# Patient Record
Sex: Female | Born: 1955 | Race: White | Hispanic: No | Marital: Married | Smoking: Never smoker
Health system: Southern US, Community
[De-identification: ages and names within clinical notes are randomized; demographics above are authoritative.]

## PROBLEM LIST (undated history)

## (undated) DIAGNOSIS — K649 Unspecified hemorrhoids: Secondary | ICD-10-CM

## (undated) DIAGNOSIS — I4891 Unspecified atrial fibrillation: Secondary | ICD-10-CM

## (undated) HISTORY — PX: OTHER SURGICAL HISTORY: SHX169

## (undated) HISTORY — DX: Unspecified hemorrhoids: K64.9

---

## 1998-05-25 ENCOUNTER — Other Ambulatory Visit: Admission: RE | Admit: 1998-05-25 | Discharge: 1998-05-25 | Payer: Self-pay | Admitting: *Deleted

## 1999-01-31 ENCOUNTER — Ambulatory Visit (HOSPITAL_COMMUNITY): Admission: RE | Admit: 1999-01-31 | Discharge: 1999-01-31 | Payer: Self-pay | Admitting: *Deleted

## 1999-01-31 ENCOUNTER — Encounter: Payer: Self-pay | Admitting: *Deleted

## 2000-02-06 ENCOUNTER — Ambulatory Visit (HOSPITAL_COMMUNITY): Admission: RE | Admit: 2000-02-06 | Discharge: 2000-02-06 | Payer: Self-pay | Admitting: *Deleted

## 2000-02-06 ENCOUNTER — Encounter: Payer: Self-pay | Admitting: *Deleted

## 2001-05-14 ENCOUNTER — Encounter: Payer: Self-pay | Admitting: *Deleted

## 2001-05-14 ENCOUNTER — Ambulatory Visit (HOSPITAL_COMMUNITY): Admission: RE | Admit: 2001-05-14 | Discharge: 2001-05-14 | Payer: Self-pay | Admitting: *Deleted

## 2001-10-14 ENCOUNTER — Other Ambulatory Visit: Admission: RE | Admit: 2001-10-14 | Discharge: 2001-10-14 | Payer: Self-pay | Admitting: *Deleted

## 2001-11-18 ENCOUNTER — Other Ambulatory Visit: Admission: RE | Admit: 2001-11-18 | Discharge: 2001-11-18 | Payer: Self-pay | Admitting: *Deleted

## 2002-09-26 ENCOUNTER — Ambulatory Visit (HOSPITAL_COMMUNITY): Admission: RE | Admit: 2002-09-26 | Discharge: 2002-09-26 | Payer: Self-pay | Admitting: *Deleted

## 2002-09-26 ENCOUNTER — Encounter: Payer: Self-pay | Admitting: *Deleted

## 2004-03-14 ENCOUNTER — Ambulatory Visit (HOSPITAL_COMMUNITY): Admission: RE | Admit: 2004-03-14 | Discharge: 2004-03-14 | Payer: Self-pay | Admitting: *Deleted

## 2004-04-22 ENCOUNTER — Encounter: Admission: RE | Admit: 2004-04-22 | Discharge: 2004-04-22 | Payer: Self-pay | Admitting: Orthopedic Surgery

## 2005-03-28 ENCOUNTER — Other Ambulatory Visit: Admission: RE | Admit: 2005-03-28 | Discharge: 2005-03-28 | Payer: Self-pay | Admitting: *Deleted

## 2006-03-26 ENCOUNTER — Ambulatory Visit (HOSPITAL_COMMUNITY): Admission: RE | Admit: 2006-03-26 | Discharge: 2006-03-26 | Payer: Self-pay | Admitting: *Deleted

## 2006-05-16 ENCOUNTER — Other Ambulatory Visit: Admission: RE | Admit: 2006-05-16 | Discharge: 2006-05-16 | Payer: Self-pay | Admitting: *Deleted

## 2007-06-04 ENCOUNTER — Other Ambulatory Visit: Admission: RE | Admit: 2007-06-04 | Discharge: 2007-06-04 | Payer: Self-pay | Admitting: *Deleted

## 2007-06-04 ENCOUNTER — Ambulatory Visit (HOSPITAL_COMMUNITY): Admission: RE | Admit: 2007-06-04 | Discharge: 2007-06-04 | Payer: Self-pay | Admitting: *Deleted

## 2008-05-13 ENCOUNTER — Emergency Department (HOSPITAL_COMMUNITY): Admission: EM | Admit: 2008-05-13 | Discharge: 2008-05-13 | Payer: Self-pay | Admitting: Emergency Medicine

## 2008-06-03 ENCOUNTER — Other Ambulatory Visit: Admission: RE | Admit: 2008-06-03 | Discharge: 2008-06-03 | Payer: Self-pay | Admitting: Gynecology

## 2008-06-08 ENCOUNTER — Ambulatory Visit (HOSPITAL_COMMUNITY): Admission: RE | Admit: 2008-06-08 | Discharge: 2008-06-08 | Payer: Self-pay | Admitting: *Deleted

## 2009-06-14 ENCOUNTER — Ambulatory Visit (HOSPITAL_COMMUNITY): Admission: RE | Admit: 2009-06-14 | Discharge: 2009-06-14 | Payer: Self-pay | Admitting: Gynecology

## 2010-06-15 ENCOUNTER — Ambulatory Visit (HOSPITAL_COMMUNITY): Admission: RE | Admit: 2010-06-15 | Discharge: 2010-06-15 | Payer: Self-pay | Admitting: Gynecology

## 2011-07-03 ENCOUNTER — Other Ambulatory Visit (HOSPITAL_COMMUNITY): Payer: Self-pay | Admitting: Internal Medicine

## 2011-07-03 DIAGNOSIS — Z1231 Encounter for screening mammogram for malignant neoplasm of breast: Secondary | ICD-10-CM

## 2011-07-12 ENCOUNTER — Ambulatory Visit (HOSPITAL_COMMUNITY)
Admission: RE | Admit: 2011-07-12 | Discharge: 2011-07-12 | Disposition: A | Discharge status: Home / Self Care | Payer: BC Managed Care – PPO | Source: Ambulatory Visit | Attending: Internal Medicine | Admitting: Internal Medicine

## 2011-07-12 DIAGNOSIS — Z1231 Encounter for screening mammogram for malignant neoplasm of breast: Secondary | ICD-10-CM | POA: Insufficient documentation

## 2011-10-19 ENCOUNTER — Other Ambulatory Visit: Payer: Self-pay | Admitting: Adult Health

## 2011-10-19 ENCOUNTER — Other Ambulatory Visit (HOSPITAL_COMMUNITY)
Admission: RE | Admit: 2011-10-19 | Discharge: 2011-10-19 | Disposition: A | Discharge status: Home / Self Care | Payer: BC Managed Care – PPO | Source: Ambulatory Visit | Attending: Obstetrics and Gynecology | Admitting: Obstetrics and Gynecology

## 2011-10-19 DIAGNOSIS — Z01419 Encounter for gynecological examination (general) (routine) without abnormal findings: Secondary | ICD-10-CM | POA: Insufficient documentation

## 2012-07-16 ENCOUNTER — Other Ambulatory Visit (HOSPITAL_COMMUNITY): Payer: Self-pay | Admitting: Internal Medicine

## 2012-07-16 DIAGNOSIS — Z1231 Encounter for screening mammogram for malignant neoplasm of breast: Secondary | ICD-10-CM

## 2012-07-24 ENCOUNTER — Ambulatory Visit (HOSPITAL_COMMUNITY): Payer: BC Managed Care – PPO

## 2012-08-07 ENCOUNTER — Ambulatory Visit (HOSPITAL_COMMUNITY)
Admission: RE | Admit: 2012-08-07 | Discharge: 2012-08-07 | Disposition: A | Discharge status: Home / Self Care | Payer: BC Managed Care – PPO | Source: Ambulatory Visit | Attending: Internal Medicine | Admitting: Internal Medicine

## 2012-08-07 DIAGNOSIS — Z1231 Encounter for screening mammogram for malignant neoplasm of breast: Secondary | ICD-10-CM | POA: Insufficient documentation

## 2012-11-18 ENCOUNTER — Other Ambulatory Visit (HOSPITAL_COMMUNITY)
Admission: RE | Admit: 2012-11-18 | Discharge: 2012-11-18 | Disposition: A | Discharge status: Home / Self Care | Payer: BC Managed Care – PPO | Source: Ambulatory Visit | Attending: Obstetrics and Gynecology | Admitting: Obstetrics and Gynecology

## 2012-11-18 ENCOUNTER — Other Ambulatory Visit: Payer: Self-pay | Admitting: Adult Health

## 2012-11-18 DIAGNOSIS — Z1151 Encounter for screening for human papillomavirus (HPV): Secondary | ICD-10-CM | POA: Insufficient documentation

## 2012-11-18 DIAGNOSIS — Z01419 Encounter for gynecological examination (general) (routine) without abnormal findings: Secondary | ICD-10-CM | POA: Insufficient documentation

## 2013-09-29 ENCOUNTER — Other Ambulatory Visit: Payer: Self-pay | Admitting: Adult Health

## 2013-09-29 DIAGNOSIS — Z1231 Encounter for screening mammogram for malignant neoplasm of breast: Secondary | ICD-10-CM

## 2013-10-15 ENCOUNTER — Ambulatory Visit (HOSPITAL_COMMUNITY): Payer: BC Managed Care – PPO

## 2013-10-23 ENCOUNTER — Ambulatory Visit (HOSPITAL_COMMUNITY)
Admission: RE | Admit: 2013-10-23 | Discharge: 2013-10-23 | Disposition: A | Discharge status: Home / Self Care | Payer: BC Managed Care – PPO | Source: Ambulatory Visit | Attending: Adult Health | Admitting: Adult Health

## 2013-10-23 DIAGNOSIS — Z1231 Encounter for screening mammogram for malignant neoplasm of breast: Secondary | ICD-10-CM | POA: Insufficient documentation

## 2013-11-25 ENCOUNTER — Other Ambulatory Visit: Payer: Self-pay | Admitting: Adult Health

## 2013-12-03 ENCOUNTER — Encounter: Payer: Self-pay | Admitting: Adult Health

## 2013-12-03 ENCOUNTER — Ambulatory Visit (INDEPENDENT_AMBULATORY_CARE_PROVIDER_SITE_OTHER): Payer: BC Managed Care – PPO | Admitting: Adult Health

## 2013-12-03 VITALS — BP 148/74 | HR 74 | Ht 66.5 in | Wt 184.0 lb

## 2013-12-03 DIAGNOSIS — Z1212 Encounter for screening for malignant neoplasm of rectum: Secondary | ICD-10-CM

## 2013-12-03 DIAGNOSIS — K649 Unspecified hemorrhoids: Secondary | ICD-10-CM

## 2013-12-03 DIAGNOSIS — Z01419 Encounter for gynecological examination (general) (routine) without abnormal findings: Secondary | ICD-10-CM

## 2013-12-03 HISTORY — DX: Unspecified hemorrhoids: K64.9

## 2013-12-03 LAB — HEMOCCULT GUIAC POC 1CARD (OFFICE)

## 2013-12-03 NOTE — Patient Instructions (Signed)
Physical in 1 year Mammogram yearly Colonoscopy  Advised Labs next year Hemorrhoids Hemorrhoids are swollen veins around the rectum or anus. There are two types of hemorrhoids:   Internal hemorrhoids. These occur in the veins just inside the rectum. They may poke through to the outside and become irritated and painful.  External hemorrhoids. These occur in the veins outside the anus and can be felt as a painful swelling or hard lump near the anus. CAUSES  Pregnancy.   Obesity.   Constipation or diarrhea.   Straining to have a bowel movement.   Sitting for long periods on the toilet.  Heavy lifting or other activity that caused you to strain.  Anal intercourse. SYMPTOMS   Pain.   Anal itching or irritation.   Rectal bleeding.   Fecal leakage.   Anal swelling.   One or more lumps around the anus.  DIAGNOSIS  Your caregiver may be able to diagnose hemorrhoids by visual examination. Other examinations or tests that may be performed include:   Examination of the rectal area with a gloved hand (digital rectal exam).   Examination of anal canal using a small tube (scope).   A blood test if you have lost a significant amount of blood.  A test to look inside the colon (sigmoidoscopy or colonoscopy). TREATMENT Most hemorrhoids can be treated at home. However, if symptoms do not seem to be getting better or if you have a lot of rectal bleeding, your caregiver may perform a procedure to help make the hemorrhoids get smaller or remove them completely. Possible treatments include:   Placing a rubber band at the base of the hemorrhoid to cut off the circulation (rubber band ligation).   Injecting a chemical to shrink the hemorrhoid (sclerotherapy).   Using a tool to burn the hemorrhoid (infrared light therapy).   Surgically removing the hemorrhoid (hemorrhoidectomy).   Stapling the hemorrhoid to block blood flow to the tissue (hemorrhoid stapling).  HOME  CARE INSTRUCTIONS   Eat foods with fiber, such as whole grains, beans, nuts, fruits, and vegetables. Ask your doctor about taking products with added fiber in them (fibersupplements).  Increase fluid intake. Drink enough water and fluids to keep your urine clear or pale yellow.   Exercise regularly.   Go to the bathroom when you have the urge to have a bowel movement. Do not wait.   Avoid straining to have bowel movements.   Keep the anal area dry and clean. Use wet toilet paper or moist towelettes after a bowel movement.   Medicated creams and suppositories may be used or applied as directed.   Only take over-the-counter or prescription medicines as directed by your caregiver.   Take warm sitz baths for 15 20 minutes, 3 4 times a day to ease pain and discomfort.   Place ice packs on the hemorrhoids if they are tender and swollen. Using ice packs between sitz baths may be helpful.   Put ice in a plastic bag.   Place a towel between your skin and the bag.   Leave the ice on for 15 20 minutes, 3 4 times a day.   Do not use a donut-shaped pillow or sit on the toilet for long periods. This increases blood pooling and pain.  SEEK MEDICAL CARE IF:  You have increasing pain and swelling that is not controlled by treatment or medicine.  You have uncontrolled bleeding.  You have difficulty or you are unable to have a bowel movement.  You have  pain or inflammation outside the area of the hemorrhoids. MAKE SURE YOU:  Understand these instructions.  Will watch your condition.  Will get help right away if you are not doing well or get worse. Document Released: 12/01/2000 Document Revised: 11/20/2012 Document Reviewed: 10/08/2012 [facility] Patient Information 2014 [place], [place].

## 2013-12-03 NOTE — Progress Notes (Signed)
Patient ID: Janet Rangel, female   DOB: 10/29/1956, 57 y.o.   MRN: [id number] History of Present Illness: Janet Rangel is a 57 year old white female, married in for physical.She had a normal pap with negative HPV 11/17/12.Has hemorrhoids.   Current Medications, Allergies, Past Medical History, Past Surgical History, Family History and Social History were reviewed in [employer] record.     Review of Systems: Patient denies any headaches, blurred vision, shortness of breath, chest pain, abdominal pain, problems with bowel movements, urination, or intercourse. No joint pain or mood swings.Has had some hemorrhoids, but better.    Physical Exam:BP 148/74  Pulse 74  Ht 5' 6.5" (1.689 m)  Wt 184 lb (83.462 kg)  BMI 29.26 kg/m2 General:  Well developed, well nourished, no acute distress Skin:  Warm and dry Neck:  Midline trachea, normal thyroid Lungs; Clear to auscultation bilaterally Breast:  No dominant palpable mass, retraction, or nipple discharge Cardiovascular: Regular rate and rhythm Abdomen:  Soft, non tender, no hepatosplenomegaly Pelvic:  External genitalia is normal in appearance.  The vagina is normal in appearance for age.               The cervix is bulbous.  Uterus is felt to be normal size, shape, and contour.  No adnexal masses or tenderness noted. Rectal: Good sphincter tone, no polyps, has hemorrhoids. Hemoccult negative. Extremities:  No swelling or varicosities noted Psych:  No mood changes, alert and cooperative, seems happy   Impression: Yearly gyn exam no pap Hemorrhoids     Plan: Physical in 1 year Mammogram yearly  Labs next year Colonoscopy advised Review handout on hemorrhoids, can use preparation H, Anusol and tucks

## 2014-10-06 ENCOUNTER — Other Ambulatory Visit: Payer: Self-pay | Admitting: Adult Health

## 2014-10-06 DIAGNOSIS — Z1231 Encounter for screening mammogram for malignant neoplasm of breast: Secondary | ICD-10-CM

## 2014-10-19 ENCOUNTER — Encounter: Payer: Self-pay | Admitting: Adult Health

## 2014-10-29 ENCOUNTER — Ambulatory Visit (HOSPITAL_COMMUNITY)
Admission: RE | Admit: 2014-10-29 | Discharge: 2014-10-29 | Disposition: A | Discharge status: Home / Self Care | Payer: BC Managed Care – PPO | Source: Ambulatory Visit | Attending: Adult Health | Admitting: Adult Health

## 2014-10-29 DIAGNOSIS — Z1231 Encounter for screening mammogram for malignant neoplasm of breast: Secondary | ICD-10-CM | POA: Insufficient documentation

## 2014-12-07 ENCOUNTER — Encounter: Payer: Self-pay | Admitting: Adult Health

## 2014-12-07 ENCOUNTER — Ambulatory Visit (INDEPENDENT_AMBULATORY_CARE_PROVIDER_SITE_OTHER): Payer: BC Managed Care – PPO | Admitting: Adult Health

## 2014-12-07 VITALS — BP 140/80 | Ht 66.5 in | Wt 172.0 lb

## 2014-12-07 DIAGNOSIS — Z01419 Encounter for gynecological examination (general) (routine) without abnormal findings: Secondary | ICD-10-CM

## 2014-12-07 DIAGNOSIS — Z1212 Encounter for screening for malignant neoplasm of rectum: Secondary | ICD-10-CM

## 2014-12-07 LAB — HEMOCCULT GUIAC POC 1CARD (OFFICE): Fecal Occult Blood, POC: NEGATIVE

## 2014-12-07 NOTE — Patient Instructions (Signed)
Pap and physical with fastin labs in 1 year Mammogram yearly Colonoscopy advised Get flu shot

## 2014-12-07 NOTE — Progress Notes (Signed)
Patient ID: Janet Rangel, female   DOB: 1956-07-26, 58 y.o.   MRN: [id number] History of Present Illness:  Janet RhodesBetty is a 58 year old white female, married in for gyn exam.She had a normal pap with negative HPV 11/18/12.No complaints.  Current Medications, Allergies, Past Medical History, Past Surgical History, Family History and Social History were reviewed in [facility] electronic medical record.     Review of Systems: Patient denies any headaches, blurred vision, shortness of breath, chest pain, abdominal pain, problems with bowel movements, urination, or intercourse. No joint pian or mood swings,she walks and line dances.    Physical Exam:BP 140/80 mmHg  Ht 5' 6.5" (1.689 m)  Wt 172 lb (78.019 kg)  BMI 27.35 kg/m2 General:  Well developed, well nourished, no acute distress Skin:  Warm and dry Neck:  Midline trachea, normal thyroid Lungs; Clear to auscultation bilaterally Breast:  No dominant palpable mass, retraction, or nipple discharge Cardiovascular: Regular rate and rhythm Abdomen:  Soft, non tender, no hepatosplenomegaly Pelvic:  External genitalia is normal in appearance, no lesions.  The vagina is normal in appearance, except has lost rugae. The cervix is smooth.  Uterus is felt to be normal size, shape, and contour.  No     adnexal masses or tenderness noted. Rectal: Good sphincter tone, no polyps, has internal hemorrhoids felt.  Hemoccult negative. Extremities:  No swelling or varicosities noted Psych:  No mood changes,alert and cooperative,seems happy   Impression: Well woman gyn exam no pap    Plan: Pap and physical with fasting labs in 1 year Mammogram yearly Get flu shot Colonoscopy advised

## 2015-12-23 ENCOUNTER — Ambulatory Visit (INDEPENDENT_AMBULATORY_CARE_PROVIDER_SITE_OTHER): Payer: BC Managed Care – PPO | Admitting: Adult Health

## 2015-12-23 ENCOUNTER — Other Ambulatory Visit (HOSPITAL_COMMUNITY)
Admission: RE | Admit: 2015-12-23 | Discharge: 2015-12-23 | Disposition: A | Discharge status: Home / Self Care | Payer: BC Managed Care – PPO | Source: Ambulatory Visit | Attending: Adult Health | Admitting: Adult Health

## 2015-12-23 ENCOUNTER — Encounter: Payer: Self-pay | Admitting: Adult Health

## 2015-12-23 VITALS — BP 140/84 | HR 60 | Temp 98.2°F | Ht 66.5 in | Wt 180.5 lb

## 2015-12-23 DIAGNOSIS — Z1211 Encounter for screening for malignant neoplasm of colon: Secondary | ICD-10-CM | POA: Diagnosis not present

## 2015-12-23 DIAGNOSIS — Z01419 Encounter for gynecological examination (general) (routine) without abnormal findings: Secondary | ICD-10-CM | POA: Diagnosis not present

## 2015-12-23 DIAGNOSIS — Z1151 Encounter for screening for human papillomavirus (HPV): Secondary | ICD-10-CM | POA: Diagnosis not present

## 2015-12-23 LAB — HEMOCCULT GUIAC POC 1CARD (OFFICE): FECAL OCCULT BLD: NEGATIVE

## 2015-12-23 NOTE — Patient Instructions (Signed)
Mammogram yearly physical in 1 year Think about colonoscopy

## 2015-12-23 NOTE — Progress Notes (Signed)
Patient ID: Janet Rangel, female   DOB: [birth year], 60 y.o.   MRN: [id number] History of Present Illness: Janet Rangel is a 60 year old white female, married in for a well woman gyn exam and pap and has had cough and cold/sinus like symptoms for about 6 days no fever and does not feel bad is taking mucinex DM. She has not had flu shot yet and does not have PCP currently, [place].  Current Medications, Allergies, Past Medical History, Past Surgical History, Family History and Social History were reviewed in [facility] electronic medical record.     Review of Systems: Patient denies any headaches, hearing loss, fatigue, blurred vision, shortness of breath, chest pain, abdominal pain, problems with bowel movements, urination, or intercourse. No joint pain or mood swings.See HPI for positives.    Physical Exam:BP 140/84 mmHg  Pulse 60  Temp(Src) 98.2 F (36.8 C)  Ht 5' 6.5" (1.689 m)  Wt 180 lb 8 oz (81.874 kg)  BMI 28.70 kg/m2 General:  Well developed, well nourished, no acute distress Skin:  Warm and dry Neck:  Midline trachea, normal thyroid, good ROM, no lymphadenopathy.  Ears with wax, throat clear, no redness,swelling or pustules, and no sinus tenderness on palpation. Lungs; Clear to auscultation bilaterally Breast:  No dominant palpable mass, retraction, or nipple discharge Cardiovascular: Regular rate and rhythm Abdomen:  Soft, non tender, no hepatosplenomegaly Pelvic:  External genitalia is normal in appearance, no lesions.  The vagina is normal in appearance. Urethra has no lesions or masses. The cervix is smooth, pap with HPV performed.  Uterus is felt to be normal size, shape, and contour.  No adnexal masses or tenderness noted.Bladder is non tender, no masses felt. Rectal: Good sphincter tone, no polyps, or hemorrhoids felt.  Hemoccult negative. Extremities/musculoskeletal:  No swelling or varicosities noted, no clubbing or cyanosis Psych:  No mood changes, alert and  cooperative,seems happy Discussed probably has viral URI, continue with mucinex and increase fluids, may last 2 weeks.   Impression: Well woman gyn exam and pap    Plan: Check CBC,CMP,TSH and lipids and Hept C antibody Mammogram yearly Physical in 1 year, pap in 3 if normal Colonoscopy advised Get flu shot

## 2015-12-24 LAB — COMPREHENSIVE METABOLIC PANEL
ALT: 17 IU/L (ref 0–32)
AST: 21 IU/L (ref 0–40)
Albumin/Globulin Ratio: 1.6 (ref 1.1–2.5)
Albumin: 4.2 g/dL (ref 3.6–4.8)
Alkaline Phosphatase: 83 IU/L (ref 39–117)
BUN/Creatinine Ratio: 13 (ref 11–26)
BUN: 12 mg/dL (ref 8–27)
Bilirubin Total: 0.5 mg/dL (ref 0.0–1.2)
CO2: 23 mmol/L (ref 18–29)
Calcium: 10.1 mg/dL (ref 8.7–10.3)
Chloride: 102 mmol/L (ref 96–106)
Creatinine, Ser: 0.9 mg/dL (ref 0.57–1.00)
GFR calc Af Amer: 80 mL/min/{1.73_m2} (ref >59)
GFR calc non Af Amer: 70 mL/min/{1.73_m2} (ref >59)
Globulin, Total: 2.7 g/dL (ref 1.5–4.5)
Glucose: 91 mg/dL (ref 65–99)
Potassium: 5.1 mmol/L (ref 3.5–5.2)
Sodium: 140 mmol/L (ref 134–144)
Total Protein: 6.9 g/dL (ref 6.0–8.5)

## 2015-12-24 LAB — CBC
Hematocrit: 39.3 % (ref 34.0–46.6)
Hemoglobin: 13.5 g/dL (ref 11.1–15.9)
MCH: 29.7 pg (ref 26.6–33.0)
MCHC: 34.4 g/dL (ref 31.5–35.7)
MCV: 86 fL (ref 79–97)
Platelets: 280 10*3/uL (ref 150–379)
RBC: 4.55 x10E6/uL (ref 3.77–5.28)
RDW: 12.5 % (ref 12.3–15.4)
WBC: 6.7 10*3/uL (ref 3.4–10.8)

## 2015-12-24 LAB — LIPID PANEL
CHOLESTEROL TOTAL: 292 mg/dL — AB (ref 100–199)
Chol/HDL Ratio: 4.2 ratio units (ref 0.0–4.4)
HDL: 69 mg/dL (ref >39)
LDL Calculated: 196 mg/dL — ABNORMAL HIGH (ref 0–99)
TRIGLYCERIDES: 137 mg/dL (ref 0–149)
VLDL Cholesterol Cal: 27 mg/dL (ref 5–40)

## 2015-12-24 LAB — HEPATITIS C ANTIBODY

## 2015-12-24 LAB — TSH: TSH: 1.95 u[IU]/mL (ref 0.450–4.500)

## 2015-12-27 ENCOUNTER — Telehealth: Payer: Self-pay | Admitting: Adult Health

## 2015-12-27 LAB — CYTOLOGY - PAP

## 2015-12-27 NOTE — Telephone Encounter (Signed)
Left message about labs, and need to work on cholesterol, through diet and exercise, recheck 6-12 months

## 2015-12-29 ENCOUNTER — Other Ambulatory Visit: Payer: Self-pay | Admitting: Adult Health

## 2015-12-29 DIAGNOSIS — Z1231 Encounter for screening mammogram for malignant neoplasm of breast: Secondary | ICD-10-CM

## 2016-01-03 ENCOUNTER — Ambulatory Visit (HOSPITAL_COMMUNITY): Payer: BC Managed Care – PPO

## 2016-01-05 ENCOUNTER — Ambulatory Visit (HOSPITAL_COMMUNITY)
Admission: RE | Admit: 2016-01-05 | Discharge: 2016-01-05 | Disposition: A | Discharge status: Home / Self Care | Payer: BC Managed Care – PPO | Source: Ambulatory Visit | Attending: Adult Health | Admitting: Adult Health

## 2016-01-05 DIAGNOSIS — Z1231 Encounter for screening mammogram for malignant neoplasm of breast: Secondary | ICD-10-CM | POA: Diagnosis not present

## 2016-12-26 ENCOUNTER — Other Ambulatory Visit (HOSPITAL_COMMUNITY): Payer: Self-pay | Admitting: Internal Medicine

## 2016-12-26 DIAGNOSIS — Z1231 Encounter for screening mammogram for malignant neoplasm of breast: Secondary | ICD-10-CM

## 2017-01-08 ENCOUNTER — Ambulatory Visit (HOSPITAL_COMMUNITY)
Admission: RE | Admit: 2017-01-08 | Discharge: 2017-01-08 | Disposition: A | Discharge status: Home / Self Care | Payer: BC Managed Care – PPO | Source: Ambulatory Visit | Attending: Internal Medicine | Admitting: Internal Medicine

## 2017-01-08 DIAGNOSIS — Z1231 Encounter for screening mammogram for malignant neoplasm of breast: Secondary | ICD-10-CM | POA: Diagnosis present

## 2017-12-19 ENCOUNTER — Other Ambulatory Visit (HOSPITAL_COMMUNITY): Payer: Self-pay | Admitting: Internal Medicine

## 2017-12-19 DIAGNOSIS — Z1231 Encounter for screening mammogram for malignant neoplasm of breast: Secondary | ICD-10-CM

## 2018-01-09 ENCOUNTER — Ambulatory Visit (HOSPITAL_COMMUNITY)
Admission: RE | Admit: 2018-01-09 | Discharge: 2018-01-09 | Disposition: A | Discharge status: Home / Self Care | Payer: BC Managed Care – PPO | Source: Ambulatory Visit | Attending: Internal Medicine | Admitting: Internal Medicine

## 2018-01-09 ENCOUNTER — Encounter (HOSPITAL_COMMUNITY): Payer: Self-pay

## 2018-01-09 ENCOUNTER — Ambulatory Visit (HOSPITAL_COMMUNITY): Payer: BC Managed Care – PPO

## 2018-01-09 DIAGNOSIS — Z1231 Encounter for screening mammogram for malignant neoplasm of breast: Secondary | ICD-10-CM | POA: Insufficient documentation

## 2019-01-02 ENCOUNTER — Other Ambulatory Visit (HOSPITAL_COMMUNITY): Payer: Self-pay | Admitting: Internal Medicine

## 2019-01-02 DIAGNOSIS — Z1231 Encounter for screening mammogram for malignant neoplasm of breast: Secondary | ICD-10-CM

## 2019-01-13 ENCOUNTER — Encounter (HOSPITAL_COMMUNITY): Payer: Self-pay

## 2019-01-13 ENCOUNTER — Ambulatory Visit (HOSPITAL_COMMUNITY)
Admission: RE | Admit: 2019-01-13 | Discharge: 2019-01-13 | Disposition: A | Discharge status: Home / Self Care | Payer: BC Managed Care – PPO | Source: Ambulatory Visit | Attending: Internal Medicine | Admitting: Internal Medicine

## 2019-01-13 DIAGNOSIS — Z1231 Encounter for screening mammogram for malignant neoplasm of breast: Secondary | ICD-10-CM | POA: Diagnosis present

## 2020-01-06 ENCOUNTER — Other Ambulatory Visit (HOSPITAL_COMMUNITY): Payer: Self-pay | Admitting: Internal Medicine

## 2020-01-06 DIAGNOSIS — Z1231 Encounter for screening mammogram for malignant neoplasm of breast: Secondary | ICD-10-CM

## 2020-01-15 ENCOUNTER — Ambulatory Visit (HOSPITAL_COMMUNITY)
Admission: RE | Admit: 2020-01-15 | Discharge: 2020-01-15 | Disposition: A | Discharge status: Home / Self Care | Payer: BC Managed Care – PPO | Source: Ambulatory Visit | Attending: Internal Medicine | Admitting: Internal Medicine

## 2020-01-15 ENCOUNTER — Other Ambulatory Visit: Payer: Self-pay

## 2020-01-15 DIAGNOSIS — Z1231 Encounter for screening mammogram for malignant neoplasm of breast: Secondary | ICD-10-CM | POA: Insufficient documentation

## 2021-01-11 ENCOUNTER — Other Ambulatory Visit (HOSPITAL_COMMUNITY): Payer: Self-pay | Admitting: Internal Medicine

## 2021-01-11 DIAGNOSIS — Z1231 Encounter for screening mammogram for malignant neoplasm of breast: Secondary | ICD-10-CM

## 2021-01-24 ENCOUNTER — Ambulatory Visit (HOSPITAL_COMMUNITY)
Admission: RE | Admit: 2021-01-24 | Discharge: 2021-01-24 | Disposition: A | Discharge status: Home / Self Care | Payer: Medicare PPO | Source: Ambulatory Visit | Attending: Internal Medicine | Admitting: Internal Medicine

## 2021-01-24 ENCOUNTER — Other Ambulatory Visit: Payer: Self-pay

## 2021-01-24 DIAGNOSIS — Z1231 Encounter for screening mammogram for malignant neoplasm of breast: Secondary | ICD-10-CM | POA: Diagnosis not present

## 2021-10-14 ENCOUNTER — Other Ambulatory Visit: Payer: Self-pay | Admitting: Cardiology

## 2021-10-14 ENCOUNTER — Other Ambulatory Visit: Payer: Self-pay

## 2021-10-14 ENCOUNTER — Ambulatory Visit: Payer: Medicare PPO | Admitting: Cardiology

## 2021-10-14 ENCOUNTER — Encounter: Payer: Self-pay | Admitting: Cardiology

## 2021-10-14 ENCOUNTER — Ambulatory Visit (INDEPENDENT_AMBULATORY_CARE_PROVIDER_SITE_OTHER): Payer: Medicare PPO

## 2021-10-14 VITALS — BP 126/82 | HR 66 | Ht 66.0 in | Wt 188.0 lb

## 2021-10-14 DIAGNOSIS — R002 Palpitations: Secondary | ICD-10-CM | POA: Diagnosis not present

## 2021-10-14 DIAGNOSIS — R000 Tachycardia, unspecified: Secondary | ICD-10-CM | POA: Diagnosis not present

## 2021-10-14 NOTE — Progress Notes (Signed)
     Clinical Summary [name] is a 65 y.o.female seen as a new consult, referred by Dr Janet Rangel for the following medical problems.  Tachycardia/Palpitations - isolated episode 2 months ago - feeling of a flutter in chest  in the evening. Lasted about 3 hours - apple watch told her possible afib.  - nothing out of the ordinary that day   Low HRs at times on apple watch, 40s to 50s. Denies any specific symptoms   Past Medical History:  Diagnosis Date   Hemorrhoids 12/03/2013     No Known Allergies   Current Outpatient Medications  Medication Sig Dispense Refill   dextromethorphan-guaiFENesin (MUCINEX DM) 30-600 MG 12hr tablet Take 1 tablet by mouth 2 (two) times daily as needed for cough.     No current facility-administered medications for this visit.     Past Surgical History:  Procedure Laterality Date   cyst removed       No Known Allergies    Family History  Problem Relation Age of Onset   Diabetes Father    Cancer Maternal Grandmother        breast     Social History [name] reports that she has never smoked. She has never used smokeless tobacco. [name] reports current alcohol use.   Review of Systems CONSTITUTIONAL: No weight loss, fever, chills, weakness or fatigue.  HEENT: Eyes: No visual loss, blurred vision, double vision or yellow sclerae.No hearing loss, sneezing, congestion, runny nose or sore throat.  SKIN: No rash or itching.  CARDIOVASCULAR: per hpi RESPIRATORY: No shortness of breath, cough or sputum.  GASTROINTESTINAL: No anorexia, nausea, vomiting or diarrhea. No abdominal pain or blood.  GENITOURINARY: No burning on urination, no polyuria NEUROLOGICAL: No headache, dizziness, syncope, paralysis, ataxia, numbness or tingling in the extremities. No change in bowel or bladder control.  MUSCULOSKELETAL: No muscle, back pain, joint pain or stiffness.  LYMPHATICS: No enlarged nodes. No history of splenectomy.  PSYCHIATRIC: No  history of depression or anxiety.  ENDOCRINOLOGIC: No reports of sweating, cold or heat intolerance. No polyuria or polydipsia.  Marland Kitchen   Physical Examination Today's Vitals   10/14/21 1312  BP: 126/82  Pulse: 66  SpO2: 98%  Weight: 188 lb (85.3 kg)  Height: 5\' 6"  (1.676 m)   Body mass index is 30.34 kg/m.  Gen: resting comfortably, no acute distress HEENT: no scleral icterus, pupils equal round and reactive, no palptable cervical adenopathy,  CV: RRR, no m/r/g no jvd Resp: Clear to auscultation bilaterally GI: abdomen is soft, non-tender, non-distended, normal bowel sounds, no hepatosplenomegaly MSK: extremities are warm, no edema.  Skin: warm, no rash Neuro:  no focal deficits Psych: appropriate affect   Assessment and Plan  Palpitations -unclear etiology - will obtain 14 day monitor to further evaluate -EKG shows NSR   F/u pending monitor results      , M.D.,

## 2021-10-14 NOTE — Patient Instructions (Signed)
Medication Instructions:  Your physician recommends that you continue on your current medications as directed. Please refer to the Current Medication list given to you today.  *If you need a refill on your cardiac medications before your next appointment, please call your pharmacy*   Lab Work: None If you have labs (blood work) drawn today and your tests are completely normal, you will receive your results only by: MyChart Message (if you have MyChart) OR A paper copy in the mail If you have any lab test that is abnormal or we need to change your treatment, we will call you to review the results.   Testing/Procedures: None   Follow-Up: At [facility], you and your health needs are our priority.  As part of our continuing mission to provide you with exceptional heart care, we have created designated Provider Care Teams.  These Care Teams include your primary Cardiologist (physician) and Advanced Practice Providers (APPs -  Physician Assistants and Nurse Practitioners) who all work together to provide you with the care you need, when you need it.  We recommend signing up for the patient portal called "MyChart".  Sign up information is provided on this After Visit Summary.  MyChart is used to connect with patients for Virtual Visits (Telemedicine).  Patients are able to view lab/test results, encounter notes, upcoming appointments, etc.  Non-urgent messages can be sent to your provider as well.   To learn more about what you can do with MyChart, go to [email or web address].    Your next appointment:   Pending Monitor Results   Other Instructions ZIO XT- Long Term Monitor Instructions   Your physician has requested you wear your ZIO patch monitor___14____days.   This is a single patch monitor.  [employer] supplies one patch monitor per enrollment.  Additional stickers are not available.   Please do not apply patch if you will be having a Nuclear Stress Test, Echocardiogram, Cardiac  CT, MRI, or Chest Xray during the time frame you would be wearing the monitor. The patch cannot be worn during these tests.  You cannot remove and re-apply the ZIO XT patch monitor.   Your ZIO patch monitor will be sent USPS Priority mail from [facility] directly to your home address. The monitor may also be mailed to a PO BOX if home delivery is not available.   It may take 3-5 days to receive your monitor after you have been enrolled.   Once you have received you monitor, please review enclosed instructions.  Your monitor has already been registered assigning a specific monitor serial # to you.   Applying the monitor   Shave hair from upper left chest.   Hold abrader disc by orange tab.  Rub abrader in 40 strokes over left upper chest as indicated in your monitor instructions.   Clean area with 4 enclosed alcohol pads .  Use all pads to assure are is cleaned thoroughly.  Let dry.   Apply patch as indicated in monitor instructions.  Patch will be place under collarbone on left side of chest with arrow pointing upward.   Rub patch adhesive wings for 2 minutes.Remove white label marked "1".  Remove white label marked "2".  Rub patch adhesive wings for 2 additional minutes.   While looking in a mirror, press and release button in center of patch.  A small green light will flash 3-4 times .  This will be your only indicator the monitor has been turned on.     Do not  shower for the first 24 hours.  You may shower after the first 24 hours.   Press button if you feel a symptom. You will hear a small click.  Record Date, Time and Symptom in the Patient Log Book.   When you are ready to remove patch, follow instructions on last 2 pages of Patient Log Book.  Stick patch monitor onto last page of Patient Log Book.   Place Patient Log Book in [place] box.  Use locking tab on box and tape box closed securely.  The [employer] and [employer] has [employer] on it.  Please place in mailbox as soon  as possible.  Your physician should have your test results approximately 7 days after the monitor has been mailed back to [facility].   Call [facility] Customer Care at [phone] if you have questions regarding your ZIO XT patch monitor.  Call them immediately if you see an orange light blinking on your monitor.   If your monitor falls off in less than 4 days contact our Monitor department at [phone].  If your monitor becomes loose or falls off after 4 days call [employer] at [phone] for suggestions on securing your monitor.

## 2021-10-28 DIAGNOSIS — R002 Palpitations: Secondary | ICD-10-CM

## 2021-12-13 ENCOUNTER — Telehealth: Payer: Self-pay

## 2021-12-13 NOTE — Telephone Encounter (Signed)
-----   Message from Antoine Poche, MD sent at 12/12/2021 11:00 AM EST ----- Monitor shows some occasional extra heart beats, sometimes she can have a few in a row. None of the findings are dangerous but can cause symptoms of heart skipping or fluttering. Its not something we have to treat unless the symptoms are ongoing and bothersome. The medication we typically use slows the heart rate mildly, something we would need to watch for her since her heart rates are on the low normal range already.   If ongoing symptoms can try lopressor 12.5mg  bid, f/u 6 months but keep [place] posted on symptoms.    Dominga Ferry MD

## 2021-12-13 NOTE — Telephone Encounter (Signed)
Patient notified and verbalized understanding. Pt stated that symptoms are not noticeable or bothersome as of now, but would keep [place] posted.

## 2022-01-09 ENCOUNTER — Other Ambulatory Visit (HOSPITAL_COMMUNITY): Payer: Self-pay | Admitting: Internal Medicine

## 2022-01-09 DIAGNOSIS — Z1231 Encounter for screening mammogram for malignant neoplasm of breast: Secondary | ICD-10-CM

## 2022-01-26 ENCOUNTER — Ambulatory Visit (HOSPITAL_COMMUNITY)
Admission: RE | Admit: 2022-01-26 | Discharge: 2022-01-26 | Disposition: A | Discharge status: Home / Self Care | Payer: Medicare PPO | Source: Ambulatory Visit | Attending: Internal Medicine | Admitting: Internal Medicine

## 2022-01-26 ENCOUNTER — Other Ambulatory Visit: Payer: Self-pay

## 2022-01-26 DIAGNOSIS — Z1231 Encounter for screening mammogram for malignant neoplasm of breast: Secondary | ICD-10-CM | POA: Diagnosis present

## 2022-08-10 IMAGING — MG MM DIGITAL SCREENING BILAT W/ TOMO AND CAD
6 of 12 series · 6 of 36 positions shown · non-contrast
Comparison: Previous exam(s).

ACR Breast Density Category a: The breast tissue is almost entirely
fatty.

CLINICAL DATA: Screening.

EXAM:
DIGITAL SCREENING BILATERAL MAMMOGRAM WITH TOMOSYNTHESIS AND CAD
TECHNIQUE: Bilateral screening digital craniocaudal and mediolateral oblique
mammograms were obtained. Bilateral screening digital breast
tomosynthesis was performed. The images were evaluated with
computer-aided detection.

[R XCCL synth-2D]
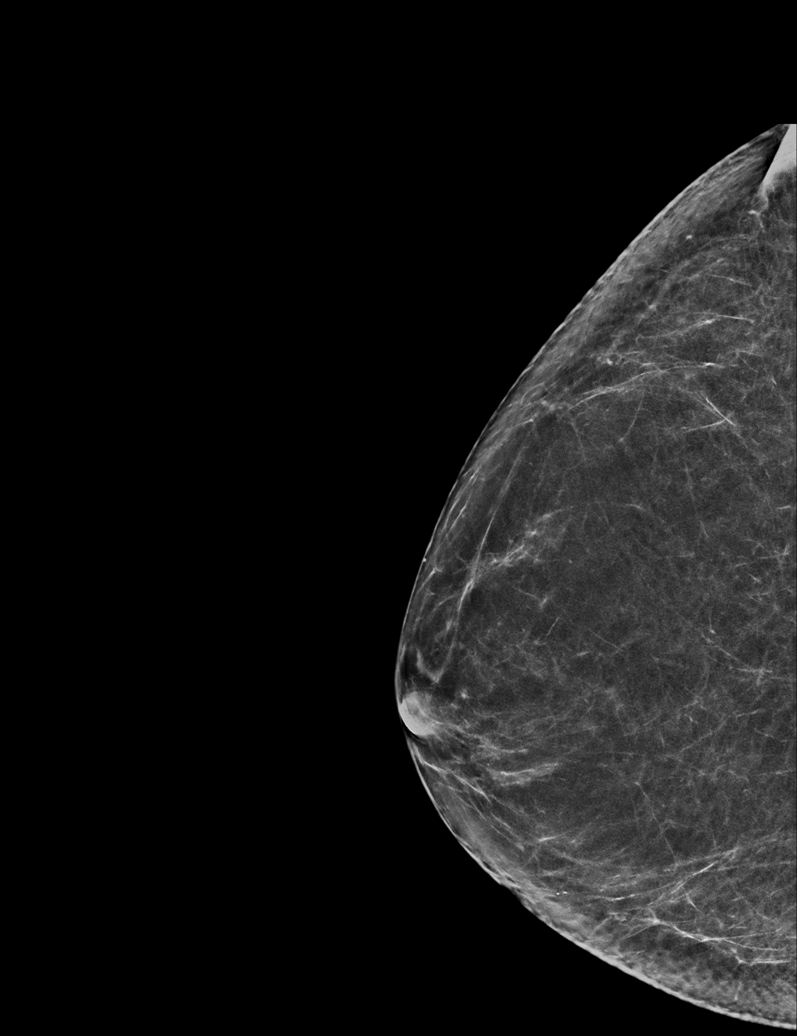

[R CC synth-2D]
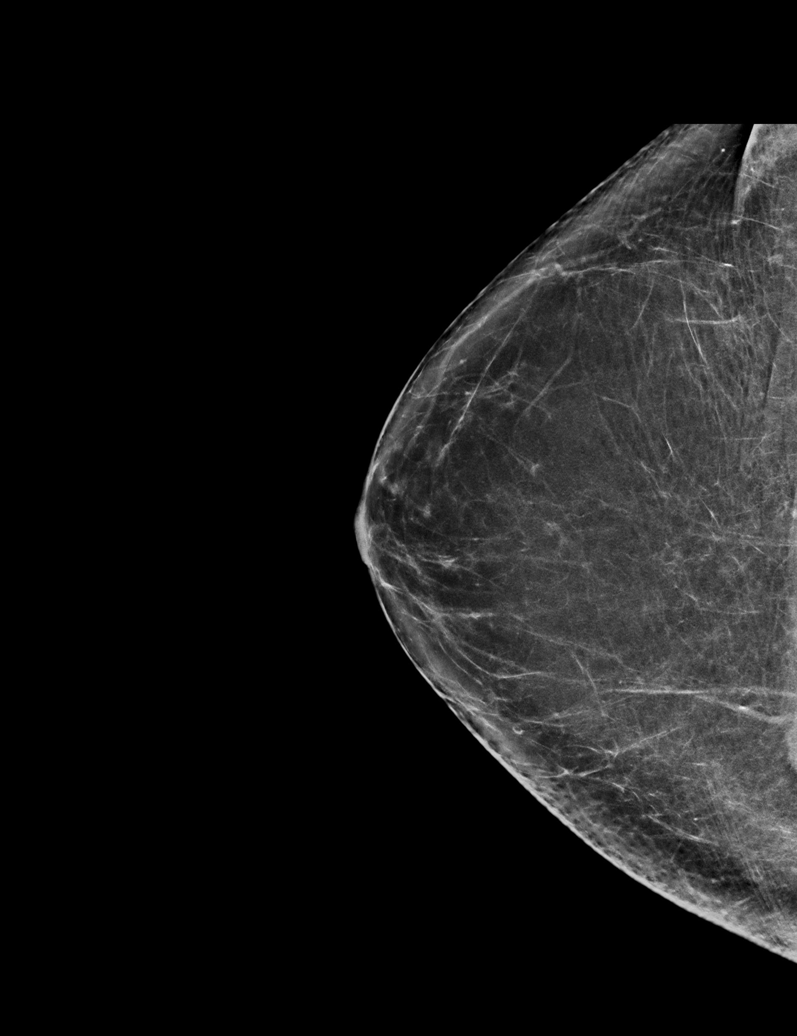

[R MLO synth-2D]
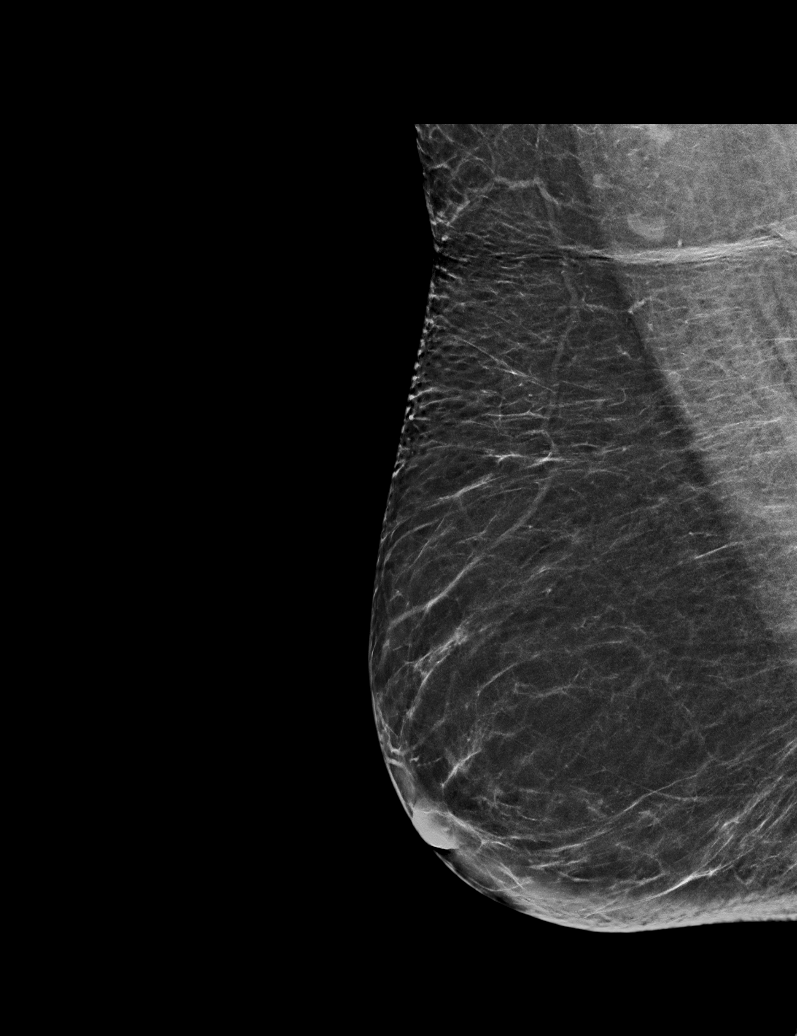

[L XCCL synth-2D]
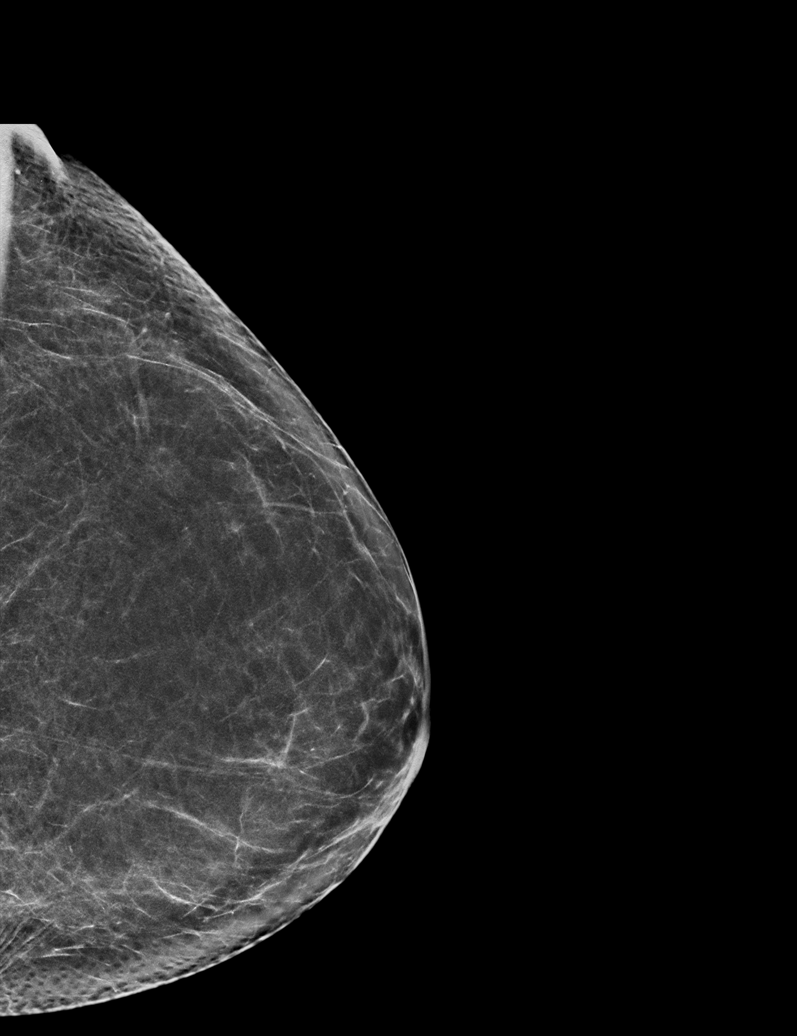

[L CC synth-2D]
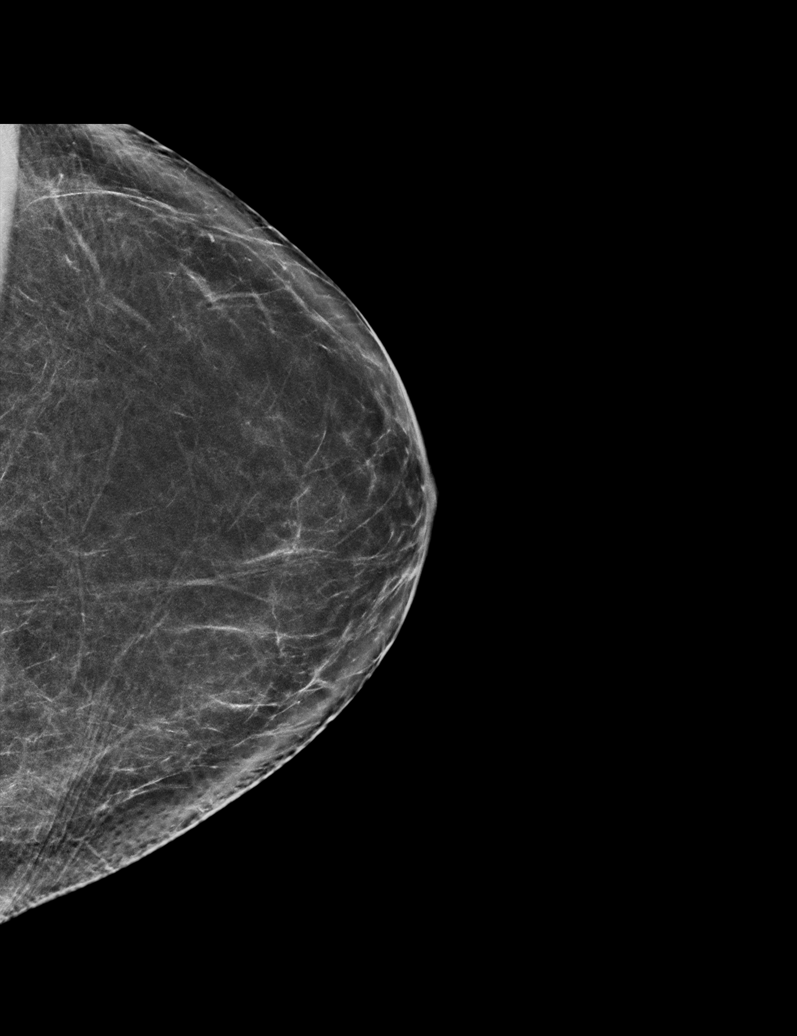

[L MLO synth-2D]
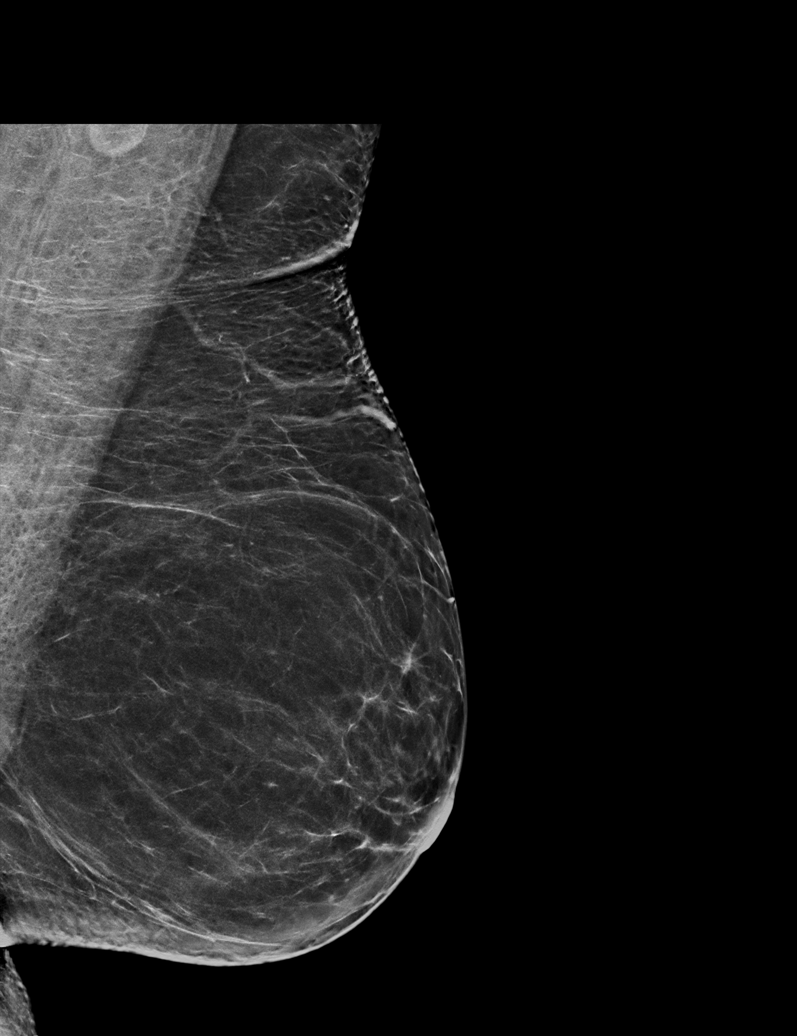

[6 of 36 positions shown; findings below may reference images not displayed]

FINDINGS: There are no findings suspicious for malignancy.
IMPRESSION: No mammographic evidence of malignancy. A result letter of this
screening mammogram will be mailed directly to the patient.

RECOMMENDATION:
Screening mammogram in one year. (Code:0E-3-N98)

BI-RADS CATEGORY  1: Negative.

## 2022-12-05 DIAGNOSIS — L570 Actinic keratosis: Secondary | ICD-10-CM | POA: Diagnosis not present

## 2022-12-05 DIAGNOSIS — X32XXXD Exposure to sunlight, subsequent encounter: Secondary | ICD-10-CM | POA: Diagnosis not present

## 2022-12-05 DIAGNOSIS — L650 Telogen effluvium: Secondary | ICD-10-CM | POA: Diagnosis not present

## 2023-02-06 ENCOUNTER — Other Ambulatory Visit (HOSPITAL_COMMUNITY): Payer: Self-pay | Admitting: Internal Medicine

## 2023-02-06 DIAGNOSIS — Z1231 Encounter for screening mammogram for malignant neoplasm of breast: Secondary | ICD-10-CM

## 2023-02-14 ENCOUNTER — Encounter (HOSPITAL_COMMUNITY): Payer: Self-pay

## 2023-02-14 ENCOUNTER — Ambulatory Visit (HOSPITAL_COMMUNITY)
Admission: RE | Admit: 2023-02-14 | Discharge: 2023-02-14 | Disposition: A | Discharge status: Home / Self Care | Payer: Medicare PPO | Source: Ambulatory Visit | Attending: Internal Medicine | Admitting: Internal Medicine

## 2023-02-14 DIAGNOSIS — Z1231 Encounter for screening mammogram for malignant neoplasm of breast: Secondary | ICD-10-CM | POA: Diagnosis not present

## 2023-04-19 ENCOUNTER — Telehealth: Payer: Self-pay | Admitting: Cardiology

## 2023-04-19 ENCOUNTER — Emergency Department (HOSPITAL_COMMUNITY): Payer: Medicare PPO

## 2023-04-19 ENCOUNTER — Encounter (HOSPITAL_COMMUNITY): Payer: Self-pay

## 2023-04-19 ENCOUNTER — Emergency Department (HOSPITAL_COMMUNITY)
Admission: EM | Admit: 2023-04-19 | Discharge: 2023-04-19 | Disposition: A | Discharge status: Home / Self Care | Payer: Medicare PPO | Attending: Emergency Medicine | Admitting: Emergency Medicine

## 2023-04-19 ENCOUNTER — Other Ambulatory Visit: Payer: Self-pay

## 2023-04-19 DIAGNOSIS — I4891 Unspecified atrial fibrillation: Secondary | ICD-10-CM | POA: Diagnosis not present

## 2023-04-19 DIAGNOSIS — D72829 Elevated white blood cell count, unspecified: Secondary | ICD-10-CM | POA: Insufficient documentation

## 2023-04-19 DIAGNOSIS — R000 Tachycardia, unspecified: Secondary | ICD-10-CM | POA: Diagnosis present

## 2023-04-19 HISTORY — DX: Unspecified atrial fibrillation: I48.91

## 2023-04-19 LAB — TSH: TSH: 2.187 u[IU]/mL (ref 0.350–4.500)

## 2023-04-19 LAB — CBC WITH DIFFERENTIAL/PLATELET
Abs Immature Granulocytes: 0.04 10*3/uL (ref 0.00–0.07)
Basophils Absolute: 0 10*3/uL (ref 0.0–0.1)
Basophils Relative: 0 %
Eosinophils Absolute: 0.1 10*3/uL (ref 0.0–0.5)
Eosinophils Relative: 0 %
HCT: 42.2 % (ref 36.0–46.0)
Hemoglobin: 13.8 g/dL (ref 12.0–15.0)
Immature Granulocytes: 0 %
Lymphocytes Relative: 15 %
Lymphs Abs: 1.7 10*3/uL (ref 0.7–4.0)
MCH: 29.7 pg (ref 26.0–34.0)
MCHC: 32.7 g/dL (ref 30.0–36.0)
MCV: 90.9 fL (ref 80.0–100.0)
Monocytes Absolute: 0.8 10*3/uL (ref 0.1–1.0)
Monocytes Relative: 7 %
Neutro Abs: 8.7 10*3/uL — ABNORMAL HIGH (ref 1.7–7.7)
Neutrophils Relative %: 78 %
Platelets: 266 10*3/uL (ref 150–400)
RBC: 4.64 MIL/uL (ref 3.87–5.11)
RDW: 12.1 % (ref 11.5–15.5)
WBC: 11.3 10*3/uL — ABNORMAL HIGH (ref 4.0–10.5)
nRBC: 0 % (ref 0.0–0.2)

## 2023-04-19 LAB — COMPREHENSIVE METABOLIC PANEL
ALT: 21 U/L (ref 0–44)
AST: 28 U/L (ref 15–41)
Albumin: 4 g/dL (ref 3.5–5.0)
Alkaline Phosphatase: 75 U/L (ref 38–126)
Anion gap: 15 (ref 5–15)
BUN: 14 mg/dL (ref 8–23)
CO2: 21 mmol/L — ABNORMAL LOW (ref 22–32)
Calcium: 10.1 mg/dL (ref 8.9–10.3)
Chloride: 101 mmol/L (ref 98–111)
Creatinine, Ser: 1 mg/dL (ref 0.44–1.00)
GFR, Estimated: >60 mL/min (ref >60)
Glucose, Bld: 111 mg/dL — ABNORMAL HIGH (ref 70–99)
Potassium: 3.8 mmol/L (ref 3.5–5.1)
Sodium: 137 mmol/L (ref 135–145)
Total Bilirubin: 0.9 mg/dL (ref 0.3–1.2)
Total Protein: 7.1 g/dL (ref 6.5–8.1)

## 2023-04-19 LAB — TROPONIN I (HIGH SENSITIVITY): Troponin I (High Sensitivity): 16 ng/L (ref <18)

## 2023-04-19 MED ORDER — SODIUM CHLORIDE 0.9 % IV BOLUS
1000.0000 mL | Freq: Once | INTRAVENOUS | Status: AC
  Administered 2023-04-19: 1000 mL via INTRAVENOUS

## 2023-04-19 MED ORDER — METOPROLOL TARTRATE 5 MG/5ML IV SOLN
5.0000 mg | Freq: Once | INTRAVENOUS | Status: AC
  Administered 2023-04-19: 5 mg via INTRAVENOUS
  Filled 2023-04-19: qty 5

## 2023-04-19 NOTE — Telephone Encounter (Signed)
Patient states at 1:30 pm today while country line dancing, she felt dizzy and lightheaded. Her Apple watch said her HR was 170 bpm, it did not indicate A-fib. She also c/o some chest tightness at that time.   She calls now from home two hours later and he HR is 149 bpm and BP is 167/109. She denies any chest tightness at this time.  She was seen 09/2021 in cardiology and we have not seen her since.We had requested 6 month f/u at that time.   I advised her to go to the ED for evaluation and she agrees.I will FYI Dr.Branch

## 2023-04-19 NOTE — ED Provider Notes (Signed)
[place] EMERGENCY DEPARTMENT AT [facility] Provider Note   CSN: [id number] Arrival date & time: 04/19/23  1623     History  Chief Complaint  Patient presents with   Tachycardia    Elevated heart rate with history of a fib.    Janet Rangel is a 67 y.o. female.  67 yo F with a chief complaints of palpitations.  The patient was line dancing and suddenly felt unwell.  Her watch told her that her heart rate was up and she called her cardiologist.  They suggested she come here for evaluation.  She has a history of palpitations but has never had it captured.  Is not on any medications.  She took something for her sinuses yesterday but denies any other new over-the-counter medications.        Home Medications Prior to Admission medications   Medication Sig Start Date End Date Taking? Authorizing Provider  dextromethorphan-guaiFENesin (MUCINEX DM) 30-600 MG 12hr tablet Take 1 tablet by mouth 2 (two) times daily as needed for cough. Patient not taking: Reported on 10/14/2021    [provider]      Allergies    Patient has no known allergies.    Review of Systems   Review of Systems  Physical Exam Updated Vital Signs BP (!) 161/93   Pulse 62   Resp (!) 9   Ht 5\' 6"  (1.676 m)   Wt 85.3 kg   SpO2 100%   BMI 30.35 kg/m  Physical Exam Vitals and nursing note reviewed.  Constitutional:      General: She is not in acute distress.    Appearance: She is well-developed. She is not diaphoretic.  HENT:     Head: Normocephalic and atraumatic.  Eyes:     Pupils: Pupils are equal, round, and reactive to light.  Cardiovascular:     Rate and Rhythm: Tachycardia present. Rhythm irregular.     Heart sounds: No murmur heard.    No friction rub. No gallop.  Pulmonary:     Effort: Pulmonary effort is normal.     Breath sounds: No wheezing or rales.  Abdominal:     General: There is no distension.     Palpations: Abdomen is soft.     Tenderness: There is no  abdominal tenderness.  Musculoskeletal:        General: No tenderness.     Cervical back: Normal range of motion and neck supple.  Skin:    General: Skin is warm and dry.  Neurological:     Mental Status: She is alert and oriented to person, place, and time.  Psychiatric:        Behavior: Behavior normal.     ED Results / Procedures / Treatments   Labs (all labs ordered are listed, but only abnormal results are displayed) Labs Reviewed  CBC WITH DIFFERENTIAL/PLATELET - Abnormal; Notable for the following components:      Result Value   WBC 11.3 (*)    Neutro Abs 8.7 (*)    All other components within normal limits  COMPREHENSIVE METABOLIC PANEL - Abnormal; Notable for the following components:   CO2 21 (*)    Glucose, Bld 111 (*)    All other components within normal limits  TSH  TROPONIN I (HIGH SENSITIVITY)  TROPONIN I (HIGH SENSITIVITY)    EKG EKG Interpretation  Date/Time:  Thursday Apr 19 2023 16:35:00 EDT Ventricular Rate:  164 PR Interval:    QRS Duration: 76 QT Interval:  256 QTC Calculation: 422 R Axis:   40 Text Interpretation: Atrial flutter with variable A-V block Marked ST abnormality, possible lateral subendocardial injury Abnormal ECG No old tracing to compare Confirmed by Melene Plan [phone]) on 04/19/2023 5:12:38 PM  Radiology DG Chest Portable 1 View  Result Date: 04/19/2023 CLINICAL DATA:  Atrial fibrillation. EXAM: PORTABLE CHEST 1 VIEW COMPARISON:  Report of the study of 02/07/2016 FINDINGS: Minimal linear opacity left lung base likely scar or atelectasis. No consolidation, pneumothorax or effusion. No edema. The extreme inferior costophrenic angles are clipped off the edge of the film. Overlapping cardiac leads. Degenerative changes of the spine. IMPRESSION: Minimal left basilar scar or atelectasis. Electronically Signed   By: Karen Kays M.D.   On: 04/19/2023 17:21    Procedures Procedures    Medications Ordered in ED Medications  metoprolol  tartrate (LOPRESSOR) injection 5 mg (5 mg Intravenous Given 04/19/23 1742)  sodium chloride 0.9 % bolus 1,000 mL (1,000 mLs Intravenous New Bag/Given 04/19/23 1743)    ED Course/ Medical Decision Making/ A&P                             Medical Decision Making Risk Prescription drug management.   67 yo F with a chief complaints of palpitations.  Patient was found to be in atrial fibrillation with RVR.  Going on just since this afternoon.  She is a very healthy 67 year old, not on any medications.  She was given a bolus dose of metoprolol and a bag of IV fluids.  On repeat assessment the patient had converted back to normal sinus rhythm.  Patient with a trivial leukocytosis.  No significant electrolyte abnormality.  Chest x-ray independently interpreted by me without focal infiltrate or pneumothorax.  She has a low CHADS2vascore.  Will have here follow up with cards.  CHA2DS2/VAS Stroke Risk Points      N/A >= 2 Points: High Risk  1 - 1.99 Points: Medium Risk  0 Points: Low Risk    Last Change: N/A      This score determines the patient's risk of having a stroke if the  patient has atrial fibrillation.      This score is not applicable to this patient. Components are not  calculated.    6:37 PM:  I have discussed the diagnosis/risks/treatment options with the patient and family.  Evaluation and diagnostic testing in the emergency department does not suggest an emergent condition requiring admission or immediate intervention beyond what has been performed at this time.  They will follow up with Cards. We also discussed returning to the ED immediately if new or worsening sx occur. We discussed the sx which are most concerning (e.g., sudden worsening pain, fever, inability to tolerate by mouth) that necessitate immediate return. Medications administered to the patient during their visit and any new prescriptions provided to the patient are listed below.  Medications given during this  visit Medications  metoprolol tartrate (LOPRESSOR) injection 5 mg (5 mg Intravenous Given 04/19/23 1742)  sodium chloride 0.9 % bolus 1,000 mL (1,000 mLs Intravenous New Bag/Given 04/19/23 1743)     The patient appears reasonably screen and/or stabilized for discharge and I doubt any other medical condition or other [facility] requiring further screening, evaluation, or treatment in the ED at this time prior to discharge.          Final Clinical Impression(s) / ED Diagnoses Final diagnoses:  Atrial fibrillation with RVR (HCC)  Rx / DC Orders ED Discharge Orders          Ordered    Ambulatory referral to Cardiology       Comments: If you have not heard from the Cardiology office within the next 72 hours please call [phone].   04/19/23 1834              Melene Plan, DO 04/19/23 1308

## 2023-04-19 NOTE — ED Triage Notes (Signed)
Pt participating in line dancing this afternoon; after 3rd dance, pt endorses becoming tachy, nauseated, dizzy; apple watch showing HR 170; hx afib; denies pain

## 2023-04-19 NOTE — ED Notes (Signed)
Pt is A&O x4. Denies HA, CP, or SOB. Denies N/V/D. Skin is intact. Pt states she had her I-watch indicate her heart rate was up to 170 and she felt a little "fluttery". Has a hx of a fib.

## 2023-04-19 NOTE — Telephone Encounter (Signed)
Patient c/o Palpitations:  High priority if patient c/o lightheadedness, shortness of breath, or chest pain  How long have you had palpitations/irregular HR/ Afib? Are you having the symptoms now? This afternoon; yes   Are you currently experiencing lightheadedness, SOB or CP? lightheadedness  Do you have a history of afib (atrial fibrillation) or irregular heart rhythm? Yes   Have you checked your BP or HR? (document readings if available): 160; 140 currently  Are you experiencing any other symptoms? No

## 2023-04-19 NOTE — Discharge Instructions (Signed)
Follow-up with a cardiologist in the office.  Please return for recurrent or persistent symptoms.

## 2023-04-19 NOTE — Telephone Encounter (Signed)
Agree if heart rate is remaining that elevated needs ER evaluation  Dominga Ferry MD

## 2023-04-19 NOTE — ED Provider Triage Note (Signed)
Emergency Medicine Provider Triage Evaluation Note  Janet Rangel , a 67 y.o. female  was evaluated in triage.  Pt complains of tachycardia.  Patient reports that she was lying dancing this afternoon when she became tachycardic, nauseated, dizzy.  Patient's watch showed that her heart rate was at 170 bpm.  She reports a history of atrial fibrillation but not currently on any medications for this.  Denies being on any blood thinners.  Reports some chest tightness with this but denies any obvious chest pain.  No syncopal episode.  Review of Systems  Positive: As above Negative: As above  Physical Exam  BP (!) 161/116 (BP Location: Right Arm)   Pulse (!) 136   Resp 18   SpO2 100%  Gen:   Awake, no distress   Resp:  Normal effort  MSK:   Moves extremities without difficulty  Other:    Medical Decision Making  Medically screening exam initiated at 4:44 PM.  Appropriate orders placed.  Elissia Spiewak was informed that the remainder of the evaluation will be completed by another provider, this initial triage assessment does not replace that evaluation, and the importance of remaining in the ED until their evaluation is complete.     Smitty Knudsen, PA-C 04/19/23 1645

## 2023-04-20 ENCOUNTER — Encounter: Payer: Self-pay | Admitting: Student

## 2023-04-20 ENCOUNTER — Ambulatory Visit: Payer: Medicare PPO | Attending: Student | Admitting: Student

## 2023-04-20 VITALS — BP 142/78 | HR 62 | Ht 66.0 in | Wt 189.0 lb

## 2023-04-20 DIAGNOSIS — I4892 Unspecified atrial flutter: Secondary | ICD-10-CM | POA: Diagnosis not present

## 2023-04-20 MED ORDER — METOPROLOL TARTRATE 25 MG PO TABS: ORAL_TABLET | ORAL | 6 refills | Status: AC

## 2023-04-20 MED ORDER — APIXABAN 5 MG PO TABS: 5.0000 mg | ORAL_TABLET | Freq: Two times a day (BID) | ORAL | 6 refills | Status: DC

## 2023-04-20 NOTE — Progress Notes (Signed)
Cardiology Office Note    Date:  04/20/2023  ID:  Janet Rangel, DOB 08-28-1956, MRN [id number] Cardiologist: Dina Rich, MD    History of Present Illness:    Janet Rangel is a 67 y.o. female with past medical history of palpitations who presents to the office today for follow-up from a recent Emergency Department visit.   She had previously been evaluated by Dr. Wyline Mood in 2022 and reported prior palpitations and a 14-day monitor was recommended for further evaluation. This showed predominantly normal sinus rhythm with rare PAC's and PVC's with 6 runs of SVT with the longest being 19 beats. It was recommended if she had ongoing symptoms, could try taking Lopressor 12.5 mg twice daily and to follow-up in 6 months.  She called the office on 04/19/2023 reporting lightheadedness and dizziness with her heart rate in the 170's which occurred while line dancing. She rechecked her vitals and heart rate was still significantly elevated, therefore she was encouraged to go to the Emergency Department. Labs showed WBC 11.3, Hb 13.8, platelets 266, Na+ 137, K+ 3.8 and creatinine 1.00. Initial troponin was negative and TSH was normal at 2.187. CXR showed minimal left basilar scar or atelectasis. EKG showed what appears most consistent with atrial flutter with RVR, heart rate 164 with ST depression along the lateral leads. By review of notes, she received IV Lopressor and converted back to normal sinus rhythm. She also received a 1L fluid bolus and was discharged home with plans to follow-up with Cardiology as an outpatient.  In talking with the patient today, she reports she has experienced intermittent dizziness in the past which resembled the symptoms she experienced yesterday but she reports the episode yesterday was more prolonged. She did check her heart rate on her iWatch and it was elevated in the 160's to 170's. Reports her baseline heart rate is typically in the 50's. She remains active at baseline and  enjoys line dancing with the [employer]. She denies any recent exertional chest pain or dyspnea on exertion when exerting herself. No recent orthopnea, PND or pitting edema.  Studies Reviewed:   EKG: EKG is not ordered today. EKG from 04/19/2023 is reviewed and demonstrates atrial flutter with RVR, HR 164 with ST abnormalities (likely rate-related).   Event Monitor: 10/2021 14 day monitor Min HR 38, Max HR 146, Avg HR 56 Rare supraventricular ectopy in the form of isolated PACs, couplets, triplets. 6 runs of SVT longest 19 beats. Occasional ventricular ectopy in the form of isolated PVCs. Rare couplets Triggered events correlate with sinus rhythm and PVCs     Patch Wear Time:  13 days and 18 hours (2022-10-28T13:52:37-0400 to 2022-11-11T07:18:32-0500)   Patient had a min HR of 38 bpm, max HR of 146 bpm, and avg HR of 56 bpm. Predominant underlying rhythm was Sinus Rhythm. 6 Supraventricular Tachycardia runs occurred, the run with the fastest interval lasting 5 beats with a max rate of 146 bpm, the  longest lasting 19 beats with an avg rate of 110 bpm. Isolated SVEs were rare (<1.0%), SVE Couplets were rare (<1.0%), and SVE Triplets were rare (<1.0%). Isolated VEs were occasional (1.8%, 18636), VE Couplets were rare (<1.0%, 16), and no VE Triplets  were present. Ventricular Bigeminy and Trigeminy were present.   Risk Assessment/Calculations:    CHA2DS2-VASc Score = 2   This indicates a 2.2% annual risk of stroke. The patient's score is based upon: CHF History: 0 HTN History: 0 Diabetes History: 0 Stroke History: 0 Vascular Disease  History: 0 Age Score: 1 Gender Score: 1             Physical Exam:   VS:  BP (!) 142/78   Pulse 62   Ht 5\' 6"  (1.676 m)   Wt 189 lb (85.7 kg)   SpO2 98%   BMI 30.51 kg/m    Wt Readings from Last 3 Encounters:  04/20/23 189 lb (85.7 kg)  04/19/23 188 lb 0.8 oz (85.3 kg)  10/14/21 188 lb (85.3 kg)     GEN: Well nourished, well developed  female appearing in no acute distress NECK: No JVD; No carotid bruits CARDIAC: RRR, no murmurs, rubs, gallops RESPIRATORY:  Clear to auscultation without rales, wheezing or rhonchi  ABDOMEN: Appears non-distended. No obvious abdominal masses. EXTREMITIES: No clubbing or cyanosis. No pitting edema.  Distal pedal pulses are 2+ bilaterally.   Assessment and Plan:   1. New-onset Atrial Flutter with RVR - This was a new diagnosis for the patient at the time of her Emergency Department evaluation but she has experienced intermittent palpitations over the past several years. She does not consume a significant amount of caffeine or alcohol. However, she does have a strong family history of atrial fibrillation. Given this is her first initial episode, will provide with an Rx for PRN Lopressor to take as needed for persistently elevated heart rate. Would not use a daily AV nodal blocking agent given her baseline heart rate in the 50's and sometimes in the 40's per her report. Will obtain an echocardiogram to rule out any structural abnormalities. If she is found to have recurrent episodes, would recommend EP evaluation for possible ablation.  - Her CHA2DS2-VASc score is 2 and reviewed risks and benefits of anticoagulation given that this is a Class II recommendation. She is in agreement with starting anticoagulation and will initiate Eliquis 5 mg twice daily. Recent labs obtained yesterday showed her hemoglobin was stable at 13.8 with platelets at 266 K.   Signed, Ellsworth Lennox, PA-C

## 2023-04-20 NOTE — Patient Instructions (Signed)
Medication Instructions:   Take Lopressor 25 mg twice a day as needed for heart rate over 120  Take Eliquis 5 mg twice a day  Labwork: None today  Testing/Procedures: Your physician has requested that you have an echocardiogram. Echocardiography is a painless test that uses sound waves to create images of your heart. It provides your doctor with information about the size and shape of your heart and how well your heart's chambers and valves are working. This procedure takes approximately one hour. There are no restrictions for this procedure. Please do NOT wear cologne, perfume, aftershave, or lotions (deodorant is allowed). Please arrive 15 minutes prior to your appointment time.   Follow-Up: 6 months  Any Other Special Instructions Will Be Listed Below (If Applicable).  If you need a refill on your cardiac medications before your next appointment, please call your pharmacy.

## 2023-05-28 ENCOUNTER — Ambulatory Visit (HOSPITAL_COMMUNITY)
Admission: RE | Admit: 2023-05-28 | Discharge: 2023-05-28 | Disposition: A | Discharge status: Home / Self Care | Payer: Medicare PPO | Source: Ambulatory Visit | Attending: Student | Admitting: Student

## 2023-05-28 DIAGNOSIS — I4892 Unspecified atrial flutter: Secondary | ICD-10-CM | POA: Insufficient documentation

## 2023-05-28 LAB — ECHOCARDIOGRAM COMPLETE
AR max vel: 1.87 cm2
AV Area VTI: 2.16 cm2
AV Area mean vel: 2.02 cm2
AV Mean grad: 3 mmHg
AV Peak grad: 6.1 mmHg
Ao pk vel: 1.23 m/s
Area-P 1/2: 3.03 cm2
S' Lateral: 2.6 cm

## 2023-05-28 NOTE — Progress Notes (Signed)
*  PRELIMINARY RESULTS* Echocardiogram 2D Echocardiogram has been performed.  Stacey Drain 05/28/2023, 11:17 AM

## 2023-06-18 DIAGNOSIS — Z1211 Encounter for screening for malignant neoplasm of colon: Secondary | ICD-10-CM | POA: Diagnosis not present

## 2023-06-23 LAB — COLOGUARD: COLOGUARD: NEGATIVE

## 2023-06-23 LAB — EXTERNAL GENERIC LAB PROCEDURE: COLOGUARD: NEGATIVE

## 2023-07-02 DIAGNOSIS — E782 Mixed hyperlipidemia: Secondary | ICD-10-CM | POA: Diagnosis not present

## 2023-07-05 DIAGNOSIS — I4891 Unspecified atrial fibrillation: Secondary | ICD-10-CM | POA: Diagnosis not present

## 2023-07-05 DIAGNOSIS — Z0000 Encounter for general adult medical examination without abnormal findings: Secondary | ICD-10-CM | POA: Diagnosis not present

## 2023-07-05 DIAGNOSIS — Z0001 Encounter for general adult medical examination with abnormal findings: Secondary | ICD-10-CM | POA: Diagnosis not present

## 2023-07-05 DIAGNOSIS — E782 Mixed hyperlipidemia: Secondary | ICD-10-CM | POA: Diagnosis not present

## 2023-07-05 DIAGNOSIS — R0781 Pleurodynia: Secondary | ICD-10-CM | POA: Diagnosis not present

## 2023-10-29 ENCOUNTER — Ambulatory Visit: Payer: Medicare PPO | Admitting: Cardiology

## 2023-10-31 ENCOUNTER — Encounter: Payer: Self-pay | Admitting: Cardiology

## 2023-10-31 ENCOUNTER — Ambulatory Visit: Payer: Medicare PPO | Attending: Cardiology | Admitting: Cardiology

## 2023-10-31 VITALS — BP 144/82 | HR 64 | Ht 66.0 in | Wt 184.8 lb

## 2023-10-31 DIAGNOSIS — I4892 Unspecified atrial flutter: Secondary | ICD-10-CM | POA: Diagnosis not present

## 2023-10-31 DIAGNOSIS — R030 Elevated blood-pressure reading, without diagnosis of hypertension: Secondary | ICD-10-CM

## 2023-10-31 DIAGNOSIS — E782 Mixed hyperlipidemia: Secondary | ICD-10-CM

## 2023-10-31 NOTE — Patient Instructions (Signed)
Medication Instructions:  Your physician recommends that you continue on your current medications as directed. Please refer to the Current Medication list given to you today.  *If you need a refill on your cardiac medications before your next appointment, please call your pharmacy*   Lab Work: None If you have labs (blood work) drawn today and your tests are completely normal, you will receive your results only by: MyChart Message (if you have MyChart) OR A paper copy in the mail If you have any lab test that is abnormal or we need to change your treatment, we will call you to review the results.   Testing/Procedures: None   Follow-Up: At [facility], you and your health needs are our priority.  As part of our continuing mission to provide you with exceptional heart care, we have created designated Provider Care Teams.  These Care Teams include your primary Cardiologist (physician) and Advanced Practice Providers (APPs -  Physician Assistants and Nurse Practitioners) who all work together to provide you with the care you need, when you need it.  We recommend signing up for the patient portal called "MyChart".  Sign up information is provided on this After Visit Summary.  MyChart is used to connect with patients for Virtual Visits (Telemedicine).  Patients are able to view lab/test results, encounter notes, upcoming appointments, etc.  Non-urgent messages can be sent to your provider as well.   To learn more about what you can do with MyChart, go to [email or web address].    Your next appointment:   6 month(s)  Provider:   You may see Dina Rich, MD or one of the following Advanced Practice Providers on your designated Care Team:   Randall An, PA-C  Jacolyn Reedy, [place]     Other Instructions  DASH Eating Plan DASH stands for Dietary Approaches to Stop Hypertension. The DASH eating plan is a healthy eating plan that has been shown to: Lower high blood  pressure (hypertension). Reduce your risk for type 2 diabetes, heart disease, and stroke. Help with weight loss. What are tips for following this plan? Reading food labels Check food labels for the amount of salt (sodium) per serving. Choose foods with less than 5 percent of the Daily Value (DV) of sodium. In general, foods with less than 300 milligrams (mg) of sodium per serving fit into this eating plan. To find whole grains, look for the word "whole" as the first word in the ingredient list. Shopping Buy products labeled as "low-sodium" or "no salt added." Buy fresh foods. Avoid canned foods and pre-made or frozen meals. Cooking Try not to add salt when you cook. Use salt-free seasonings or herbs instead of table salt or sea salt. Check with your health care provider or pharmacist before using salt substitutes. Do not fry foods. Cook foods in healthy ways, such as baking, boiling, grilling, roasting, or broiling. Cook using oils that are good for your heart. These include olive, canola, avocado, soybean, and sunflower oil. Meal planning  Eat a balanced diet. This should include: 4 or more servings of fruits and 4 or more servings of vegetables each day. Try to fill half of your plate with fruits and vegetables. 6-8 servings of whole grains each day. 6 or less servings of lean meat, poultry, or fish each day. 1 oz is 1 serving. A 3 oz (85 g) serving of meat is about the same size as the palm of your hand. One egg is 1 oz (28 g). 2-3 servings of  low-fat dairy each day. One serving is 1 cup (237 mL). 1 serving of nuts, seeds, or beans 5 times each week. 2-3 servings of heart-healthy fats. Healthy fats called omega-3 fatty acids are found in foods such as walnuts, flaxseeds, fortified milks, and eggs. These fats are also found in cold-water fish, such as sardines, salmon, and mackerel. Limit how much you eat of: Canned or prepackaged foods. Food that is high in trans fat, such as fried  foods. Food that is high in saturated fat, such as fatty meat. Desserts and other sweets, sugary drinks, and other foods with added sugar. Full-fat dairy products. Do not salt foods before eating. Do not eat more than 4 egg yolks a week. Try to eat at least 2 vegetarian meals a week. Eat more home-cooked food and less restaurant, buffet, and fast food. Lifestyle When eating at a restaurant, ask if your food can be made with less salt or no salt. If you drink alcohol: Limit how much you have to: 0-1 drink a day if you are female. 0-2 drinks a day if you are female. Know how much alcohol is in your drink. In the [place] one drink is one 12 oz bottle of beer (355 mL), one 5 oz glass of wine (148 mL), or one 1 oz glass of hard liquor (44 mL). General information Avoid eating more than 2,300 mg of salt a day. If you have hypertension, you may need to reduce your sodium intake to 1,500 mg a day. Work with your provider to stay at a healthy body weight or lose weight. Ask what the best weight range is for you. On most days of the week, get at least 30 minutes of exercise that causes your heart to beat faster. This may include walking, swimming, or biking. Work with your provider or dietitian to adjust your eating plan to meet your specific calorie needs. What foods should I eat? Fruits All fresh, dried, or frozen fruit. Canned fruits that are in their natural juice and do not have sugar added to them. Vegetables Fresh or frozen vegetables that are raw, steamed, roasted, or grilled. Low-sodium or reduced-sodium tomato and vegetable juice. Low-sodium or reduced-sodium tomato sauce and tomato paste. Low-sodium or reduced-sodium canned vegetables. Grains Whole-grain or whole-wheat bread. Whole-grain or whole-wheat pasta. Brown rice. Orpah Cobb. Bulgur. Whole-grain and low-sodium cereals. Pita bread. Low-fat, low-sodium crackers. Whole-wheat flour tortillas. Meats and other proteins Skinless  chicken or [place]. Ground chicken or [place]. Pork with fat trimmed off. Fish and seafood. Egg whites. Dried beans, peas, or lentils. Unsalted nuts, nut butters, and seeds. Unsalted canned beans. Lean cuts of beef with fat trimmed off. Low-sodium, lean precooked or cured meat, such as sausages or meat loaves. Dairy Low-fat (1%) or fat-free (skim) milk. Reduced-fat, low-fat, or fat-free cheeses. Nonfat, low-sodium ricotta or cottage cheese. Low-fat or nonfat yogurt. Low-fat, low-sodium cheese. Fats and oils Soft margarine without trans fats. Vegetable oil. Reduced-fat, low-fat, or light mayonnaise and salad dressings (reduced-sodium). Canola, safflower, olive, avocado, soybean, and sunflower oils. Avocado. Seasonings and condiments Herbs. Spices. Seasoning mixes without salt. Other foods Unsalted popcorn and pretzels. Fat-free sweets. The items listed above may not be all the foods and drinks you can have. Talk to a dietitian to learn more. What foods should I avoid? Fruits Canned fruit in a light or heavy syrup. Fried fruit. Fruit in cream or butter sauce. Vegetables Creamed or fried vegetables. Vegetables in a cheese sauce. Regular canned vegetables that are not marked as  low-sodium or reduced-sodium. Regular canned tomato sauce and paste that are not marked as low-sodium or reduced-sodium. Regular tomato and vegetable juices that are not marked as low-sodium or reduced-sodium. Rosita Fire. Olives. Grains Baked goods made with fat, such as croissants, muffins, or some breads. Dry pasta or rice meal packs. Meats and other proteins Fatty cuts of meat. Ribs. Fried meat. Tomasa Blase. Bologna, salami, and other precooked or cured meats, such as sausages or meat loaves, that are not lean and low in sodium. Fat from the back of a pig (fatback). Bratwurst. Salted nuts and seeds. Canned beans with added salt. Canned or smoked fish. Whole eggs or egg yolks. Chicken or [place] with skin. Dairy Whole or 2% milk, cream,  and half-and-half. Whole or full-fat cream cheese. Whole-fat or sweetened yogurt. Full-fat cheese. Nondairy creamers. Whipped toppings. Processed cheese and cheese spreads. Fats and oils Butter. Stick margarine. Lard. Shortening. Ghee. Bacon fat. Tropical oils, such as coconut, palm kernel, or palm oil. Seasonings and condiments Onion salt, garlic salt, seasoned salt, table salt, and sea salt. Worcestershire sauce. Tartar sauce. Barbecue sauce. Teriyaki sauce. Soy sauce, including reduced-sodium soy sauce. Steak sauce. Canned and packaged gravies. Fish sauce. Oyster sauce. Cocktail sauce. Store-bought horseradish. Ketchup. Mustard. Meat flavorings and tenderizers. Bouillon cubes. Hot sauces. Pre-made or packaged marinades. Pre-made or packaged taco seasonings. Relishes. Regular salad dressings. Other foods Salted popcorn and pretzels. The items listed above may not be all the foods and drinks you should avoid. Talk to a dietitian to learn more. Where to find more information National Heart, Lung, and Blood Institute (NHLBI): BuffaloDryCleaner.gl American Heart Association (AHA): heart.org Academy of Nutrition and Dietetics: eatright.org National Kidney Foundation (NKF): kidney.org This information is not intended to replace advice given to you by your health care provider. Make sure you discuss any questions you have with your health care provider. Document Revised: 12/21/2022 Document Reviewed: 12/21/2022 Elsevier Patient Education  2024 Elsevier Inc.  Heart-Healthy Eating Plan Many factors influence your heart health, including eating and exercise habits. Heart health is also called coronary health. Coronary risk increases with abnormal blood fat (lipid) levels. A heart-healthy eating plan includes limiting unhealthy fats, increasing healthy fats, limiting salt (sodium) intake, and making other diet and lifestyle changes. Cooking Cook foods using methods other than frying. Baking, boiling, grilling,  and broiling are all good options. Other ways to reduce fat include: Removing the skin from poultry. Removing all visible fats from meats. Steaming vegetables in water or broth. Meal planning  At meals, imagine dividing your plate into fourths: Fill one-half of your plate with vegetables and green salads. Fill one-fourth of your plate with whole grains. Fill one-fourth of your plate with lean protein foods. Eat 2-4 cups of vegetables per day. One cup of vegetables equals 1 cup (91 g) broccoli or cauliflower florets, 2 medium carrots, 1 large bell pepper, 1 large sweet potato, 1 large tomato, 1 medium white potato, 2 cups (150 g) raw leafy greens. Eat 1-2 cups of fruit per day. One cup of fruit equals 1 small apple, 1 large banana, 1 cup (237 g) mixed fruit, 1 large orange,  cup (82 g) dried fruit, 1 cup (240 mL) 100% fruit juice. Eat more foods that contain soluble fiber. Examples include apples, broccoli, carrots, beans, peas, and barley. Aim to get 25-30 g of fiber per day. Increase your consumption of legumes, nuts, and seeds to 4-5 servings per week. One serving of dried beans or legumes equals  cup (90 g) cooked, 1  serving of nuts is  oz (12 almonds, 24 pistachios, or 7 walnut halves), and 1 serving of seeds equals  oz (8 g). Fats Choose healthy fats more often. Choose monounsaturated and polyunsaturated fats, such as olive and canola oils, avocado oil, flaxseeds, walnuts, almonds, and seeds. Eat more omega-3 fats. Choose salmon, mackerel, sardines, tuna, flaxseed oil, and ground flaxseeds. Aim to eat fish at least 2 times each week. Check food labels carefully to identify foods with trans fats or high amounts of saturated fat. Limit saturated fats. These are found in animal products, such as meats, butter, and cream. Plant sources of saturated fats include palm oil, palm kernel oil, and coconut oil. Avoid foods with partially hydrogenated oils in them. These contain trans fats.  Examples are stick margarine, some tub margarines, cookies, crackers, and other baked goods. Avoid fried foods. General information Eat more home-cooked food and less restaurant, buffet, and fast food. Limit or avoid alcohol. Limit foods that are high in added sugar and simple starches such as foods made using white refined flour (white breads, pastries, sweets). Lose weight if you are overweight. Losing just 5-10% of your body weight can help your overall health and prevent diseases such as diabetes and heart disease. Monitor your sodium intake, especially if you have high blood pressure. Talk with your health care provider about your sodium intake. Try to incorporate more vegetarian meals weekly. What foods should I eat? Fruits All fresh, canned (in natural juice), or frozen fruits. Vegetables Fresh or frozen vegetables (raw, steamed, roasted, or grilled). Green salads. Grains Most grains. Choose whole wheat and whole grains most of the time. Rice and pasta, including brown rice and pastas made with whole wheat. Meats and other proteins Lean, well-trimmed beef, veal, pork, and lamb. Chicken and [place] without skin. All fish and shellfish. Wild duck, rabbit, pheasant, and venison. Egg whites or low-cholesterol egg substitutes. Dried beans, peas, lentils, and tofu. Seeds and most nuts. Dairy Low-fat or nonfat cheeses, including ricotta and mozzarella. Skim or 1% milk (liquid, powdered, or evaporated). Buttermilk made with low-fat milk. Nonfat or low-fat yogurt. Fats and oils Non-hydrogenated (trans-free) margarines. Vegetable oils, including soybean, sesame, sunflower, olive, avocado, peanut, safflower, corn, canola, and cottonseed. Salad dressings or mayonnaise made with a vegetable oil. Beverages Water (mineral or sparkling). Coffee and tea. Unsweetened ice tea. Diet beverages. Sweets and desserts Sherbet, gelatin, and fruit ice. Small amounts of dark chocolate. Limit all sweets and  desserts. Seasonings and condiments All seasonings and condiments. The items listed above may not be a complete list of foods and beverages you can eat. Contact a dietitian for more options. What foods should I avoid? Fruits Canned fruit in heavy syrup. Fruit in cream or butter sauce. Fried fruit. Limit coconut. Vegetables Vegetables cooked in cheese, cream, or butter sauce. Fried vegetables. Grains Breads made with saturated or trans fats, oils, or whole milk. Croissants. Sweet rolls. Donuts. High-fat crackers, such as cheese crackers and chips. Meats and other proteins Fatty meats, such as hot dogs, ribs, sausage, bacon, rib-eye roast or steak. High-fat deli meats, such as salami and bologna. Caviar. Domestic duck and goose. Organ meats, such as liver. Dairy Cream, sour cream, cream cheese, and creamed cottage cheese. Whole-milk cheeses. Whole or 2% milk (liquid, evaporated, or condensed). Whole buttermilk. Cream sauce or high-fat cheese sauce. Whole-milk yogurt. Fats and oils Meat fat, or shortening. Cocoa butter, hydrogenated oils, palm oil, coconut oil, palm kernel oil. Solid fats and shortenings, including bacon fat, salt pork,  lard, and butter. Nondairy cream substitutes. Salad dressings with cheese or sour cream. Beverages Regular sodas and any drinks with added sugar. Sweets and desserts Frosting. Pudding. Cookies. Cakes. Pies. Milk chocolate or white chocolate. Buttered syrups. Full-fat ice cream or ice cream drinks. The items listed above may not be a complete list of foods and beverages to avoid. Contact a dietitian for more information. Summary Heart-healthy meal planning includes limiting unhealthy fats, increasing healthy fats, limiting salt (sodium) intake and making other diet and lifestyle changes. Lose weight if you are overweight. Losing just 5-10% of your body weight can help your overall health and prevent diseases such as diabetes and heart disease. Focus on eating a  balance of foods, including fruits and vegetables, low-fat or nonfat dairy, lean protein, nuts and legumes, whole grains, and heart-healthy oils and fats. This information is not intended to replace advice given to you by your health care provider. Make sure you discuss any questions you have with your health care provider. Document Revised: 01/09/2022 Document Reviewed: 01/09/2022 Elsevier Patient Education  2024 [employer].

## 2023-10-31 NOTE — Progress Notes (Signed)
Clinical Summary Ms. Porcaro is a 67 y.o.female seen today for follow up of the following mediacal problems.    1.Aflutter - prior history of palpitations - 11/2021 monitor: min HR 38, Max HR 146, avg 56. Rare PACs, occasional PVCs. 6 runs SVT longest 19 beats.   - new diagnosis during ER visit 04/2023. Converted in ER with IV lopressor - management complicated as her SR rates are 40s to 50s.  -started on eliquis, CHADS2Vasc score is 2.   - has prn lopressor, has only needed twice. Last episode was oct 15, lasted about 1 hour and resolved.  - compliant with eliquis, no bleeding on eliquis   2.HLD - 06/2023 TC 275 TG 150 HDL 63 LDL 185 - ASCVD risk score is 10%, intermediate risk  Past Medical History:  Diagnosis Date   A-fib (HCC)    Hemorrhoids 12/03/2013     No Known Allergies   Current Outpatient Medications  Medication Sig Dispense Refill   apixaban (ELIQUIS) 5 MG TABS tablet Take 1 tablet (5 mg total) by mouth 2 (two) times daily. 60 tablet 6   metoprolol tartrate (LOPRESSOR) 25 MG tablet Take 25 mg twice a day as needed for heart rate greater than 120 60 tablet 6   No current facility-administered medications for this visit.     Past Surgical History:  Procedure Laterality Date   cyst removed       No Known Allergies    Family History  Problem Relation Age of Onset   Atrial fibrillation Mother    Diabetes Father    Cancer Maternal Grandmother        breast     Social History Ms. Vialpando reports that she has never smoked. She has never used smokeless tobacco. Ms. Roudebush reports current alcohol use.   Review of Systems CONSTITUTIONAL: No weight loss, fever, chills, weakness or fatigue.  HEENT: Eyes: No visual loss, blurred vision, double vision or yellow sclerae.No hearing loss, sneezing, congestion, runny nose or sore throat.  SKIN: No rash or itching.  CARDIOVASCULAR: per hpi RESPIRATORY: No shortness of breath, cough or sputum.   GASTROINTESTINAL: No anorexia, nausea, vomiting or diarrhea. No abdominal pain or blood.  GENITOURINARY: No burning on urination, no polyuria NEUROLOGICAL: No headache, dizziness, syncope, paralysis, ataxia, numbness or tingling in the extremities. No change in bowel or bladder control.  MUSCULOSKELETAL: No muscle, back pain, joint pain or stiffness.  LYMPHATICS: No enlarged nodes. No history of splenectomy.  PSYCHIATRIC: No history of depression or anxiety.  ENDOCRINOLOGIC: No reports of sweating, cold or heat intolerance. No polyuria or polydipsia.  Marland Kitchen   Physical Examination Today's Vitals   10/31/23 1553 10/31/23 1642  BP: (!) 152/88 (!) 144/82  Pulse: 64   SpO2: 99%   Weight: 184 lb 12.8 oz (83.8 kg)   Height: 5\' 6"  (1.676 m)    Body mass index is 29.83 kg/m.  Gen: resting comfortably, no acute distress HEENT: no scleral icterus, pupils equal round and reactive, no palptable cervical adenopathy,  CV: RRR, no m/rg, no jvd Resp: Clear to auscultation bilaterally GI: abdomen is soft, non-tender, non-distended, normal bowel sounds, no hepatosplenomegaly MSK: extremities are warm, no edema.  Skin: warm, no rash Neuro:  no focal deficits Psych: appropriate affect   Diagnostic Studies  10/2021 monitor 14 day monitor Min HR 38, Max HR 146, Avg HR 56 Rare supraventricular ectopy in the form of isolated PACs, couplets, triplets. 6 runs of SVT longest 19 beats.  Occasional ventricular ectopy in the form of isolated PVCs. Rare couplets Triggered events correlate with sinus rhythm and PVCs     Patch Wear Time:  13 days and 18 hours (2022-10-28T13:52:37-0400 to 2022-11-11T07:18:32-0500)   Patient had a min HR of 38 bpm, max HR of 146 bpm, and avg HR of 56 bpm. Predominant underlying rhythm was Sinus Rhythm. 6 Supraventricular Tachycardia runs occurred, the run with the fastest interval lasting 5 beats with a max rate of 146 bpm, the  longest lasting 19 beats with an avg rate of  110 bpm. Isolated SVEs were rare (<1.0%), SVE Couplets were rare (<1.0%), and SVE Triplets were rare (<1.0%). Isolated VEs were occasional (1.8%, 18636), VE Couplets were rare (<1.0%, 16), and no VE Triplets  were present. Ventricular Bigeminy and Trigeminy were present.    05/2023 echo 1. Left ventricular ejection fraction, by estimation, is 60 to 65%. The  left ventricle has normal function. The left ventricle has no regional  wall motion abnormalities. Left ventricular diastolic parameters were  normal.   2. Right ventricular systolic function is normal. The right ventricular  size is normal. Tricuspid regurgitation signal is inadequate for assessing  PA pressure.   3. The mitral valve is normal in structure. No evidence of mitral valve  regurgitation. No evidence of mitral stenosis.   4. The aortic valve is tricuspid. Aortic valve regurgitation is not  visualized. No aortic stenosis is present.   5. The inferior vena cava is normal in size with greater than 50%  respiratory variability, suggesting right atrial pressure of 3 mmHg.    Assessment and Plan  1.Paroxysmal aflutter - doing well, very limited episodes - on just prn av nodal agent given baseline bradycardia. If aflutter progresses over time would need to refer to EP to consider rhythm strategy - CHADS2Vasc score is 2 (age, gender), however bp's have been elevated recently and may perhaps have an additional risk factors. Continue eliquis at this time  2. Elevated blood pressure - she will call with bp's in 1 week - would likely start norvasc if needed   3. HLD - ASCVD 10%, she favors working on lifestyle modification initially as opposed to statin therapy - follow labs for now.       Antoine Poche, M.D.

## 2023-11-19 ENCOUNTER — Encounter: Payer: Self-pay | Admitting: Cardiology

## 2023-11-26 ENCOUNTER — Other Ambulatory Visit: Payer: Self-pay | Admitting: Student

## 2023-11-27 NOTE — Telephone Encounter (Signed)
Prescription refill request for Eliquis received. Indication:afib Last office visit:11/24 Scr:1.00  5/24 Age: 67 Weight:83.8  kg  Prescription refilled

## 2023-12-10 ENCOUNTER — Telehealth: Payer: Self-pay | Admitting: *Deleted

## 2023-12-10 NOTE — Telephone Encounter (Signed)
-----   Message from Dina Rich sent at 12/09/2023  7:40 AM EST ----- BP's look ok, low normal range. No med changes are needed. Be sure to stay well hydrated at least 2 liters of water daily.   Dominga Ferry MD

## 2023-12-10 NOTE — Telephone Encounter (Signed)
Patient notified and verbalized understanding. 

## 2024-01-03 DIAGNOSIS — H524 Presbyopia: Secondary | ICD-10-CM | POA: Diagnosis not present

## 2024-01-03 DIAGNOSIS — H2513 Age-related nuclear cataract, bilateral: Secondary | ICD-10-CM | POA: Diagnosis not present

## 2024-01-10 DIAGNOSIS — L570 Actinic keratosis: Secondary | ICD-10-CM | POA: Diagnosis not present

## 2024-01-10 DIAGNOSIS — L568 Other specified acute skin changes due to ultraviolet radiation: Secondary | ICD-10-CM | POA: Diagnosis not present

## 2024-01-10 DIAGNOSIS — D485 Neoplasm of uncertain behavior of skin: Secondary | ICD-10-CM | POA: Diagnosis not present

## 2024-01-10 DIAGNOSIS — D225 Melanocytic nevi of trunk: Secondary | ICD-10-CM | POA: Diagnosis not present

## 2024-01-18 DIAGNOSIS — H2513 Age-related nuclear cataract, bilateral: Secondary | ICD-10-CM | POA: Diagnosis not present

## 2024-01-24 DIAGNOSIS — L570 Actinic keratosis: Secondary | ICD-10-CM | POA: Diagnosis not present

## 2024-01-24 DIAGNOSIS — L568 Other specified acute skin changes due to ultraviolet radiation: Secondary | ICD-10-CM | POA: Diagnosis not present

## 2024-01-24 DIAGNOSIS — L988 Other specified disorders of the skin and subcutaneous tissue: Secondary | ICD-10-CM | POA: Diagnosis not present

## 2024-01-24 DIAGNOSIS — D225 Melanocytic nevi of trunk: Secondary | ICD-10-CM | POA: Diagnosis not present

## 2024-01-24 DIAGNOSIS — Z789 Other specified health status: Secondary | ICD-10-CM | POA: Diagnosis not present

## 2024-02-14 DIAGNOSIS — Z872 Personal history of diseases of the skin and subcutaneous tissue: Secondary | ICD-10-CM | POA: Diagnosis not present

## 2024-02-14 DIAGNOSIS — L570 Actinic keratosis: Secondary | ICD-10-CM | POA: Diagnosis not present

## 2024-02-14 DIAGNOSIS — L568 Other specified acute skin changes due to ultraviolet radiation: Secondary | ICD-10-CM | POA: Diagnosis not present

## 2024-02-14 DIAGNOSIS — D2271 Melanocytic nevi of right lower limb, including hip: Secondary | ICD-10-CM | POA: Diagnosis not present

## 2024-02-14 DIAGNOSIS — D485 Neoplasm of uncertain behavior of skin: Secondary | ICD-10-CM | POA: Diagnosis not present

## 2024-02-15 DIAGNOSIS — J019 Acute sinusitis, unspecified: Secondary | ICD-10-CM | POA: Diagnosis not present

## 2024-02-20 DIAGNOSIS — H25811 Combined forms of age-related cataract, right eye: Secondary | ICD-10-CM | POA: Diagnosis not present

## 2024-02-20 DIAGNOSIS — H2511 Age-related nuclear cataract, right eye: Secondary | ICD-10-CM | POA: Diagnosis not present

## 2024-02-22 ENCOUNTER — Other Ambulatory Visit (HOSPITAL_COMMUNITY): Payer: Self-pay | Admitting: Internal Medicine

## 2024-02-22 DIAGNOSIS — Z1231 Encounter for screening mammogram for malignant neoplasm of breast: Secondary | ICD-10-CM

## 2024-02-27 ENCOUNTER — Ambulatory Visit (HOSPITAL_COMMUNITY)
Admission: RE | Admit: 2024-02-27 | Discharge: 2024-02-27 | Disposition: A | Discharge status: Home / Self Care | Source: Ambulatory Visit | Attending: Internal Medicine | Admitting: Internal Medicine

## 2024-02-27 DIAGNOSIS — Z1231 Encounter for screening mammogram for malignant neoplasm of breast: Secondary | ICD-10-CM | POA: Insufficient documentation

## 2024-02-29 DIAGNOSIS — L659 Nonscarring hair loss, unspecified: Secondary | ICD-10-CM | POA: Diagnosis not present

## 2024-03-27 DIAGNOSIS — L648 Other androgenic alopecia: Secondary | ICD-10-CM | POA: Diagnosis not present

## 2024-03-27 DIAGNOSIS — L570 Actinic keratosis: Secondary | ICD-10-CM | POA: Diagnosis not present

## 2024-04-09 DIAGNOSIS — R7989 Other specified abnormal findings of blood chemistry: Secondary | ICD-10-CM | POA: Diagnosis not present

## 2024-06-04 ENCOUNTER — Other Ambulatory Visit: Payer: Self-pay | Admitting: Student

## 2024-06-04 NOTE — Telephone Encounter (Signed)
 Pt last saw Dr Amanda Jungling 10/31/23, last labs 07/02/23 Creat 0.95 at Dr Lucendia Rusk per [facility], age 68, weight 83.8kg, based on specified criteria pt is on appropriate dosage of Eliquis  5mg  BID for aflutter.  Will refill rx.

## 2024-06-24 DIAGNOSIS — E782 Mixed hyperlipidemia: Secondary | ICD-10-CM | POA: Diagnosis not present

## 2024-06-26 DIAGNOSIS — L570 Actinic keratosis: Secondary | ICD-10-CM | POA: Diagnosis not present

## 2024-06-26 DIAGNOSIS — L648 Other androgenic alopecia: Secondary | ICD-10-CM | POA: Diagnosis not present

## 2024-06-30 DIAGNOSIS — I4891 Unspecified atrial fibrillation: Secondary | ICD-10-CM | POA: Diagnosis not present

## 2024-06-30 DIAGNOSIS — E782 Mixed hyperlipidemia: Secondary | ICD-10-CM | POA: Diagnosis not present

## 2024-06-30 DIAGNOSIS — Z23 Encounter for immunization: Secondary | ICD-10-CM | POA: Diagnosis not present

## 2024-06-30 DIAGNOSIS — Z0000 Encounter for general adult medical examination without abnormal findings: Secondary | ICD-10-CM | POA: Diagnosis not present

## 2024-08-06 DIAGNOSIS — L570 Actinic keratosis: Secondary | ICD-10-CM | POA: Diagnosis not present

## 2024-08-06 DIAGNOSIS — L565 Disseminated superficial actinic porokeratosis (DSAP): Secondary | ICD-10-CM | POA: Diagnosis not present

## 2024-08-06 DIAGNOSIS — L648 Other androgenic alopecia: Secondary | ICD-10-CM | POA: Diagnosis not present

## 2024-09-09 ENCOUNTER — Ambulatory Visit: Attending: Cardiology | Admitting: Cardiology

## 2024-09-09 ENCOUNTER — Encounter: Payer: Self-pay | Admitting: Cardiology

## 2024-09-09 VITALS — BP 150/78 | HR 58 | Ht 66.0 in | Wt 188.6 lb

## 2024-09-09 DIAGNOSIS — Z136 Encounter for screening for cardiovascular disorders: Secondary | ICD-10-CM | POA: Diagnosis not present

## 2024-09-09 DIAGNOSIS — I4892 Unspecified atrial flutter: Secondary | ICD-10-CM | POA: Diagnosis not present

## 2024-09-09 DIAGNOSIS — D6869 Other thrombophilia: Secondary | ICD-10-CM | POA: Diagnosis not present

## 2024-09-09 NOTE — Progress Notes (Signed)
 Clinical Summary Janet Rangel is a 68 y.o.female seen today for follow up of the following mediacal problems.      1.Aflutter - prior history of palpitations - 11/2021 monitor: min HR 38, Max HR 146, avg 56. Rare PACs, occasional PVCs. 6 runs SVT longest 19 beats.    - new diagnosis during ER visit 04/2023. Converted in ER with IV lopressor  - management complicated as her SR rates are 40s to 50s.  -started on eliquis , CHADS2Vasc score is 2.     - no recent palpitations - last episode was 07/19/24, took prn lopressor  - no bleeding on eliquis .       2.HLD - 06/2023 TC 275 TG 150 HDL 63 LDL 185 - 06/2024 TC 258 TG 141 HDL 63 LDL 170 - ASCVD risk score is 10%, intermediate risk - has been hesitant to consider statin   3. Elevated bp - often elevated in clinic. Last visit was elevated, home numbers reported after visit were reasonable - she reports home bp's 120s/70s-80s    Past Medical History:  Diagnosis Date   A-fib (HCC)    Hemorrhoids 12/03/2013     No Known Allergies   Current Outpatient Medications  Medication Sig Dispense Refill   ELIQUIS  5 MG TABS tablet TAKE 1 TABLET(5 MG) BY MOUTH TWICE DAILY 60 tablet 6   metoprolol  tartrate (LOPRESSOR ) 25 MG tablet Take 25 mg twice a day as needed for heart rate greater than 120 60 tablet 6   No current facility-administered medications for this visit.     Past Surgical History:  Procedure Laterality Date   cyst removed       No Known Allergies    Family History  Problem Relation Age of Onset   Atrial fibrillation Mother    Diabetes Father    Cancer Maternal Grandmother        breast     Social History Janet Rangel reports that she has never smoked. She has never used smokeless tobacco. Janet Rangel reports current alcohol use.     Physical Examination Today's Vitals   09/09/24 0832 09/09/24 0842  BP: (!) 146/80 (!) 150/78  Pulse: (!) 58 (!) 58  SpO2: 96% 96%  Weight: 188 lb 9.6 oz (85.5 kg)    Height: 5' 6 (1.676 m)    Body mass index is 30.44 kg/m.  Gen: resting comfortably, no acute distress HEENT: no scleral icterus, pupils equal round and reactive, no palptable cervical adenopathy,  CV: RRR, no m/rg, no jvd Resp: Clear to auscultation bilaterally GI: abdomen is soft, non-tender, non-distended, normal bowel sounds, no hepatosplenomegaly MSK: extremities are warm, no edema.  Skin: warm, no rash Neuro:  no focal deficits Psych: appropriate affect   Diagnostic Studies   10/2021 monitor 14 day monitor Min HR 38, Max HR 146, Avg HR 56 Rare supraventricular ectopy in the form of isolated PACs, couplets, triplets. 6 runs of SVT longest 19 beats. Occasional ventricular ectopy in the form of isolated PVCs. Rare couplets Triggered events correlate with sinus rhythm and PVCs     Patch Wear Time:  13 days and 18 hours (2022-10-28T13:52:37-0400 to 2022-11-11T07:18:32-0500)   Patient had a min HR of 38 bpm, max HR of 146 bpm, and avg HR of 56 bpm. Predominant underlying rhythm was Sinus Rhythm. 6 Supraventricular Tachycardia runs occurred, the run with the fastest interval lasting 5 beats with a max rate of 146 bpm, the  longest lasting 19 beats with an avg rate of  110 bpm. Isolated SVEs were rare (<1.0%), SVE Couplets were rare (<1.0%), and SVE Triplets were rare (<1.0%). Isolated VEs were occasional (1.8%, 18636), VE Couplets were rare (<1.0%, 16), and no VE Triplets  were present. Ventricular Bigeminy and Trigeminy were present.      05/2023 echo 1. Left ventricular ejection fraction, by estimation, is 60 to 65%. The  left ventricle has normal function. The left ventricle has no regional  wall motion abnormalities. Left ventricular diastolic parameters were  normal.   2. Right ventricular systolic function is normal. The right ventricular  size is normal. Tricuspid regurgitation signal is inadequate for assessing  PA pressure.   3. The mitral valve is normal in  structure. No evidence of mitral valve  regurgitation. No evidence of mitral stenosis.   4. The aortic valve is tricuspid. Aortic valve regurgitation is not  visualized. No aortic stenosis is present.   5. The inferior vena cava is normal in size with greater than 50%  respiratory variability, suggesting right atrial pressure of 3 mmHg.     Assessment and Plan  1.Paroxysmal aflutter/acquired thrombophilia - doing well, very limited episodes - on just prn av nodal agent given baseline bradycardia. If aflutter progresses over time would need to refer to EP to consider rhythm strategy - we will continue current therapy at this time including eliquis  for stroke prevention   2. Elevated blood pressure - often elevated in clinic but home numbers at goal, she will report home bp's in 1 week     3. HLD - ASCVD 10%, has been somewhat hesitant to consider statin - plan for coronary calcium score to further risk stratify   F/u 6 months   Dorn PHEBE Ross, M.D.

## 2024-09-09 NOTE — Patient Instructions (Signed)
 Medication Instructions:  Your physician recommends that you continue on your current medications as directed. Please refer to the Current Medication list given to you today.  *If you need a refill on your cardiac medications before your next appointment, please call your pharmacy*  Lab Work: None If you have labs (blood work) drawn today and your tests are completely normal, you will receive your results only by: MyChart Message (if you have MyChart) OR A paper copy in the mail If you have any lab test that is abnormal or we need to change your treatment, we will call you to review the results.  Testing/Procedures: Self Pay Coronary Calcium Score  Follow-Up: At [facility], you and your health needs are our priority.  As part of our continuing mission to provide you with exceptional heart care, our providers are all part of one team.  This team includes your primary Cardiologist (physician) and Advanced Practice Providers or APPs (Physician Assistants and Nurse Practitioners) who all work together to provide you with the care you need, when you need it.  Your next appointment:   6 month(s)  Provider:   You may see Alvan Carrier, MD or one of the following Advanced Practice Providers on your designated Care Team:   Laymon Qua, PA-C  Sc[place]ia [place], [place] Olivia Pavy, [place]     We recommend signing up for the patient portal called MyChart.  Sign up information is provided on this After Visit Summary.  MyChart is used to connect with patients for Virtual Visits (Telemedicine).  Patients are able to view lab/test results, encounter notes, upcoming appointments, etc.  Non-urgent messages can be sent to your provider as well.   To learn more about what you can do with MyChart, go to [email or web address].   Other Instructions

## 2024-09-17 DIAGNOSIS — L565 Disseminated superficial actinic porokeratosis (DSAP): Secondary | ICD-10-CM | POA: Diagnosis not present

## 2024-09-17 DIAGNOSIS — L570 Actinic keratosis: Secondary | ICD-10-CM | POA: Diagnosis not present

## 2024-09-17 DIAGNOSIS — L648 Other androgenic alopecia: Secondary | ICD-10-CM | POA: Diagnosis not present

## 2024-09-22 DIAGNOSIS — H2512 Age-related nuclear cataract, left eye: Secondary | ICD-10-CM | POA: Diagnosis not present

## 2024-09-22 DIAGNOSIS — Z961 Presence of intraocular lens: Secondary | ICD-10-CM | POA: Diagnosis not present

## 2024-09-22 DIAGNOSIS — H524 Presbyopia: Secondary | ICD-10-CM | POA: Diagnosis not present

## 2024-09-23 ENCOUNTER — Ambulatory Visit (HOSPITAL_COMMUNITY)
Admission: RE | Admit: 2024-09-23 | Discharge: 2024-09-23 | Disposition: A | Discharge status: Home / Self Care | Payer: Self-pay | Source: Ambulatory Visit | Attending: Cardiology | Admitting: Cardiology

## 2024-09-23 DIAGNOSIS — Z136 Encounter for screening for cardiovascular disorders: Secondary | ICD-10-CM | POA: Insufficient documentation

## 2024-09-24 ENCOUNTER — Encounter: Payer: Self-pay | Admitting: Cardiology

## 2024-09-24 ENCOUNTER — Ambulatory Visit: Attending: Internal Medicine

## 2024-09-24 DIAGNOSIS — R030 Elevated blood-pressure reading, without diagnosis of hypertension: Secondary | ICD-10-CM

## 2024-09-24 NOTE — Progress Notes (Signed)
 Patient walked into the office today to compare bp machine to office reading.   Office reading:140/82  BP machine:140/86  Patient stated bp has not been elevated at home. Pt stated that bp readings at home differ from office visit.  Pt thinks she may have some anxiousness at office.

## 2024-10-14 ENCOUNTER — Telehealth: Payer: Self-pay | Admitting: Cardiology

## 2024-10-14 DIAGNOSIS — H2512 Age-related nuclear cataract, left eye: Secondary | ICD-10-CM | POA: Diagnosis not present

## 2024-10-14 NOTE — Telephone Encounter (Signed)
 Pt calling to f.u on results from ct cardiac score. Please advise.

## 2024-10-14 NOTE — Telephone Encounter (Signed)
 Test done 10/13/24, I will forward to Dr.Branch

## 2024-10-20 ENCOUNTER — Ambulatory Visit: Payer: Self-pay | Admitting: Cardiology

## 2024-10-20 NOTE — Telephone Encounter (Signed)
 Alvan Dorn FALCON, MD    10/20/24 10:48 AM Result Note Coronary calcium score is zero, meaning no calcium has been detected in the arteries of the heart. Based on this ok not to start a statin at this time   JINNY Alvan MD    The patient has been notified of the result and verbalized understanding.  All questions (if any) were answered. Bernett Dorothyann LABOR, RN 10/20/2024 11:35 AM  PCP copied

## 2024-11-05 DIAGNOSIS — H2189 Other specified disorders of iris and ciliary body: Secondary | ICD-10-CM | POA: Diagnosis not present

## 2024-11-05 DIAGNOSIS — H25812 Combined forms of age-related cataract, left eye: Secondary | ICD-10-CM | POA: Diagnosis not present

## 2024-11-05 DIAGNOSIS — H2512 Age-related nuclear cataract, left eye: Secondary | ICD-10-CM | POA: Diagnosis not present

## 2025-01-16 ENCOUNTER — Telehealth: Payer: Self-pay | Admitting: Cardiology

## 2025-01-19 MED ORDER — APIXABAN 5 MG PO TABS: 5.0000 mg | ORAL_TABLET | Freq: Two times a day (BID) | ORAL | 5 refills | Status: DC

## 2025-01-19 NOTE — Telephone Encounter (Signed)
 Prescription refill request for Eliquis  received. Indication: AFIB Last office visit: 08/2024 Scr: .99 (06/2024 ON [facility]) Age: 69 Weight: 85.5KG

## 2025-01-20 ENCOUNTER — Other Ambulatory Visit: Payer: Self-pay | Admitting: Cardiology

## 2025-01-20 DIAGNOSIS — I4892 Unspecified atrial flutter: Secondary | ICD-10-CM

## 2025-01-21 NOTE — Telephone Encounter (Signed)
 Eliquis  5mg  refill request received. Patient is 69 years old, weight-85.5kg, Crea-0.99 on 07/04/24 via [employer], Diagnosis-Aflutter, and last seen by Dr. Alvan on 09/09/24. Dose is appropriate based on dosing criteria. Will send in refill to requested pharmacy.
